# Patient Record
Sex: Male | Born: 1965 | Race: White | Hispanic: No | Marital: Married | Smoking: Current some day smoker
Health system: Southern US, Community
[De-identification: ages and names within clinical notes are randomized; demographics above are authoritative.]

## PROBLEM LIST (undated history)

## (undated) DIAGNOSIS — M109 Gout, unspecified: Secondary | ICD-10-CM

## (undated) HISTORY — PX: HERNIA REPAIR: SHX51

## (undated) HISTORY — DX: Gout, unspecified: M10.9

---

## 2009-05-27 LAB — HM COLONOSCOPY

## 2011-06-20 ENCOUNTER — Ambulatory Visit: Payer: BC Managed Care – PPO

## 2011-06-20 ENCOUNTER — Ambulatory Visit (INDEPENDENT_AMBULATORY_CARE_PROVIDER_SITE_OTHER): Payer: BC Managed Care – PPO | Admitting: Internal Medicine

## 2011-06-20 VITALS — BP 147/100 | HR 62 | Temp 98.6°F | Resp 18 | Ht 70.0 in | Wt 203.0 lb

## 2011-06-20 DIAGNOSIS — M25579 Pain in unspecified ankle and joints of unspecified foot: Secondary | ICD-10-CM

## 2011-06-20 DIAGNOSIS — M109 Gout, unspecified: Secondary | ICD-10-CM

## 2011-06-20 DIAGNOSIS — M25572 Pain in left ankle and joints of left foot: Secondary | ICD-10-CM

## 2011-06-20 MED ORDER — COLCHICINE 0.6 MG PO TABS: 0.6000 mg | ORAL_TABLET | Freq: Two times a day (BID) | ORAL | Status: DC

## 2011-06-20 MED ORDER — METHYLPREDNISOLONE ACETATE 80 MG/ML IJ SUSP
80.0000 mg | Freq: Once | INTRAMUSCULAR | Status: AC
  Administered 2011-06-20: 120 mg via INTRAMUSCULAR

## 2011-06-20 MED ORDER — HYDROCODONE-ACETAMINOPHEN 5-500 MG PO TABS: 1.0000 | ORAL_TABLET | Freq: Four times a day (QID) | ORAL | Status: AC | PRN

## 2011-06-20 NOTE — Progress Notes (Signed)
  Subjective:    Patient ID: Dustin Logan, male    DOB: [birth year], 46 y.o.   MRN: [id number]  HPI Has swollen painful foot, hx of gout attack R foot, L elbow, never L foot before. No fever, no trauma    Review of Systems ETOH, NICOTINE    Objective:   Physical Exam  L foot swollen and tender, lateral ankle and foot most tender Mildly red and warm No other joint [facility] reading (PRIMARY) by  Dr.Armoni Depass NAD, swelling present      Assessment & Plan:  Gout with normal uric acid levels Needs CPE soon Depomedrol 120mg  IM colcrys .6mg  bid prn Vicodin 5/500 prn

## 2011-06-20 NOTE — Patient Instructions (Signed)
Gout Gout is an inflammatory condition (arthritis) caused by a buildup of uric acid crystals in the joints. Uric acid is a chemical that is normally present in the blood. Under some circumstances, uric acid can form into crystals in your joints. This causes joint redness, soreness, and swelling (inflammation). Repeat attacks are common. Over time, uric acid crystals can form into masses (tophi) near a joint, causing disfigurement. Gout is treatable and often preventable. CAUSES  The disease begins with elevated levels of uric acid in the blood. Uric acid is produced by your body when it breaks down a naturally found substance called purines. This also happens when you eat certain foods such as meats and fish. Causes of an elevated uric acid level include:  Being passed down from parent to child (heredity).   Diseases that cause increased uric acid production (obesity, psoriasis, some cancers).   Excessive alcohol use.   Diet, especially diets rich in meat and seafood.   Medicines, including certain cancer-fighting drugs (chemotherapy), diuretics, and aspirin.   Chronic kidney disease. The kidneys are no longer able to remove uric acid well.   Problems with metabolism.  Conditions strongly associated with gout include:  Obesity.   High blood pressure.   High cholesterol.   Diabetes.  Not everyone with elevated uric acid levels gets gout. It is not understood why some people get gout and others do not. Surgery, joint injury, and eating too much of certain foods are some of the factors that can lead to gout. SYMPTOMS   An attack of gout comes on quickly. It causes intense pain with redness, swelling, and warmth in a joint.   Fever can occur.   Often, only one joint is involved. Certain joints are more commonly involved:   Base of the big toe.   Knee.   Ankle.   Wrist.   Finger.  Without treatment, an attack usually goes away in a few days to weeks. Between attacks, you  usually will not have symptoms, which is different from many other forms of arthritis. DIAGNOSIS  Your caregiver will suspect gout based on your symptoms and exam. Removal of fluid from the joint (arthrocentesis) is done to check for uric acid crystals. Your caregiver will give you a medicine that numbs the area (local anesthetic) and use a needle to remove joint fluid for exam. Gout is confirmed when uric acid crystals are seen in joint fluid, using a special microscope. Sometimes, blood, urine, and X-ray tests are also used. TREATMENT  There are 2 phases to gout treatment: treating the sudden onset (acute) attack and preventing attacks (prophylaxis). Treatment of an Acute Attack  Medicines are used. These include anti-inflammatory medicines or steroid medicines.   An injection of steroid medicine into the affected joint is sometimes necessary.   The painful joint is rested. Movement can worsen the arthritis.   You may use warm or cold treatments on painful joints, depending which works best for you.   Discuss the use of coffee, vitamin C, or cherries with your caregiver. These may be helpful treatment options.  Treatment to Prevent Attacks After the acute attack subsides, your caregiver may advise prophylactic medicine. These medicines either help your kidneys eliminate uric acid from your body or decrease your uric acid production. You may need to stay on these medicines for a very long time. The early phase of treatment with prophylactic medicine can be associated with an increase in acute gout attacks. For this reason, during the first few months   of treatment, your caregiver may also advise you to take medicines usually used for acute gout treatment. Be sure you understand your caregiver's directions. You should also discuss dietary treatment with your caregiver. Certain foods such as meats and fish can increase uric acid levels. Other foods such as dairy can decrease levels. Your caregiver  can give you a list of foods to avoid. HOME CARE INSTRUCTIONS   Do not take aspirin to relieve pain. This raises uric acid levels.   Only take over-the-counter or prescription medicines for pain, discomfort, or fever as directed by your caregiver.   Rest the joint as much as possible. When in bed, keep sheets and blankets off painful areas.   Keep the affected joint raised (elevated).   Use crutches if the painful joint is in your leg.   Drink enough water and fluids to keep your urine clear or pale yellow. This helps your body get rid of uric acid. Do not drink alcoholic beverages. They slow the passage of uric acid.   Follow your caregiver's dietary instructions. Pay careful attention to the amount of protein you eat. Your daily diet should emphasize fruits, vegetables, whole grains, and fat-free or low-fat milk products.   Maintain a healthy body weight.  SEEK MEDICAL CARE IF:   You have an oral temperature above 102 F (38.9 C).   You develop diarrhea, vomiting, or any side effects from medicines.   You do not feel better in 24 hours, or you are getting worse.  SEEK IMMEDIATE MEDICAL CARE IF:   Your joint becomes suddenly more tender and you have:   Chills.   An oral temperature above 102 F (38.9 C), not controlled by medicine.  MAKE SURE YOU:   Understand these instructions.   Will watch your condition.   Will get help right away if you are not doing well or get worse.  Document Released: 03/10/2000 Document Revised: 03/02/2011 Document Reviewed: 06/21/2009 Exit[employer] Patient Information 2012 [employer]. 

## 2011-12-31 ENCOUNTER — Other Ambulatory Visit: Payer: Self-pay | Admitting: Internal Medicine

## 2012-01-02 ENCOUNTER — Other Ambulatory Visit: Payer: Self-pay | Admitting: Internal Medicine

## 2012-01-13 ENCOUNTER — Other Ambulatory Visit: Payer: Self-pay | Admitting: Internal Medicine

## 2012-03-24 ENCOUNTER — Other Ambulatory Visit: Payer: Self-pay | Admitting: Physician Assistant

## 2013-10-03 ENCOUNTER — Ambulatory Visit (INDEPENDENT_AMBULATORY_CARE_PROVIDER_SITE_OTHER): Payer: BC Managed Care – PPO | Admitting: Emergency Medicine

## 2013-10-03 VITALS — BP 130/88 | HR 60 | Temp 98.6°F | Resp 18 | Ht 70.75 in | Wt 202.0 lb

## 2013-10-03 DIAGNOSIS — M109 Gout, unspecified: Secondary | ICD-10-CM | POA: Insufficient documentation

## 2013-10-03 DIAGNOSIS — M10071 Idiopathic gout, right ankle and foot: Secondary | ICD-10-CM

## 2013-10-03 MED ORDER — METHYLPREDNISOLONE ACETATE 80 MG/ML IJ SUSP
120.0000 mg | Freq: Once | INTRAMUSCULAR | Status: AC
  Administered 2013-10-03: 120 mg via INTRAMUSCULAR

## 2013-10-03 MED ORDER — COLCHICINE 0.6 MG PO TABS: ORAL_TABLET | ORAL | Status: DC

## 2013-10-03 NOTE — Progress Notes (Signed)
[facility] [address], [place] [id number] [phone]  Date:  10/03/2013   Name:  Dustin Logan   DOB:  05-24-1965   MRN:  [id number]  PCP:  Shade FloodGREENE,JEFFREY R, MD    Chief Complaint: Gout   History of Present Illness:  Dustin Logan is a 48 y.o. very pleasant male patient who presents with the following:  He is here today with a gout attack.  He has it in his right leg- he notes this right knee is swollen, and his right ankle and toe are bothersome.  He has not had a severe attack in about one year.  He had a depo- medrol shot a couple of years ago that helped him a lot.    He is otherwise generally healthy NKI to his leg, this seems like a gout attack to him.   He has no other complaint today  There are no active problems to display for this patient.   Past Medical History  Diagnosis Date  . Gout attack     No past surgical history on file.  History  Substance Use Topics  . Smoking status: Current Some Day Smoker    Types: Cigars  . Smokeless tobacco: Never Used     Comment: smokes 10 cigars in a week  . Alcohol Use: Not on file    No family history on file.  No Known Allergies  Medication list has been reviewed and updated.  Current Outpatient Prescriptions on File Prior to Visit  Medication Sig Dispense Refill  . COLCRYS 0.6 MG tablet TAKE 1 TABLET TWICE A DAY  60 tablet  1   No current facility-administered medications on file prior to visit.    Review of Systems:  As per HPI- otherwise negative.   Physical Examination: Filed Vitals:   10/03/13 1109  BP: 130/88  Pulse: 60  Temp: 98.6 F (37 C)  Resp: 18   Filed Vitals:   10/03/13 1109  Height: 5' 10.75" (1.797 m)  Weight: 202 lb (91.627 kg)   Body mass index is 28.37 kg/(m^2). Ideal Body Weight: Weight in (lb) to have BMI = 25: 177.6  GEN: WDWN, NAD, Non-toxic, A & O x 3, looks well, does have a tic where he shrugs his shoulders frequently  HEENT: Atraumatic,  Normocephalic. Neck supple. No masses, No LAD. Ears and Nose: No external deformity. CV: RRR, No M/G/R. No JVD. No thrill. No extra heart sounds. PULM: CTA B, no wheezes, crackles, rhonchi. No retractions. No resp. distress. No accessory muscle use. EXTR: No c/c/e NEURO Normal gait.  PSYCH: Normally interactive. Conversant. Not depressed or anxious appearing.  Calm demeanor.  Right knee: effusion, tender to ROM, minimal warmth. His main tenderness right now is in his ankle- and he also has some sx in his 2nd toe and great toe.  Normal ROM of ankle.     Assessment and Plan: Idiopathic gout of right foot, unspecified chronicity - Plan: methylPREDNISolone acetate (DEPO-MEDROL) injection 120 mg, colchicine (COLCRYS) 0.6 MG tablet  Given IM injection of depo- medrol today, and refilled his colchicine and explained use. No NSIADS for a few days See patient instructions for more details.     Signed Abbe AmsterdamJessica Copland, MD

## 2013-10-03 NOTE — Patient Instructions (Signed)
We gave you a shot of steroids for your gout today. You can also use colchicine as needed for gout flare.  Let me know if you are not better in the next couple of days- Sooner if worse.

## 2014-09-04 ENCOUNTER — Telehealth: Payer: Self-pay | Admitting: Family Medicine

## 2014-09-04 NOTE — Telephone Encounter (Signed)
Patient is requesting gout medication.    [id number]

## 2015-01-04 ENCOUNTER — Ambulatory Visit (INDEPENDENT_AMBULATORY_CARE_PROVIDER_SITE_OTHER): Payer: BLUE CROSS/BLUE SHIELD | Admitting: Physician Assistant

## 2015-01-04 VITALS — BP 126/84 | HR 77 | Temp 98.8°F | Resp 18 | Ht 71.0 in | Wt 201.0 lb

## 2015-01-04 DIAGNOSIS — M10071 Idiopathic gout, right ankle and foot: Secondary | ICD-10-CM | POA: Diagnosis not present

## 2015-01-04 DIAGNOSIS — M109 Gout, unspecified: Secondary | ICD-10-CM

## 2015-01-04 LAB — POCT CBC
GRANULOCYTE PERCENT: 79.2 % (ref 37–80)
HCT, POC: 41.4 % — AB (ref 43.5–53.7)
Hemoglobin: 14.3 g/dL (ref 14.1–18.1)
LYMPH, POC: 1.4 (ref 0.6–3.4)
MCH, POC: 28.9 pg (ref 27–31.2)
MCHC: 34.5 g/dL (ref 31.8–35.4)
MCV: 83.7 fL (ref 80–97)
MID (CBC): 0.7 (ref 0–0.9)
MPV: 6.7 fL (ref 0–99.8)
PLATELET COUNT, POC: 328 10*3/uL (ref 142–424)
POC Granulocyte: 7.9 — AB (ref 2–6.9)
POC LYMPH %: 13.7 % (ref 10–50)
POC MID %: 7.1 %M (ref 0–12)
RBC: 4.94 M/uL (ref 4.69–6.13)
RDW, POC: 12.7 %
WBC: 10 10*3/uL (ref 4.6–10.2)

## 2015-01-04 LAB — URIC ACID: URIC ACID, SERUM: 8.2 mg/dL — AB (ref 4.0–7.8)

## 2015-01-04 MED ORDER — TRIAMCINOLONE ACETONIDE 40 MG/ML IJ SUSP
40.0000 mg | Freq: Once | INTRAMUSCULAR | Status: AC
  Administered 2015-01-04: 40 mg via INTRAMUSCULAR

## 2015-01-04 MED ORDER — HYDROCODONE-ACETAMINOPHEN 5-325 MG PO TABS: 1.0000 | ORAL_TABLET | Freq: Three times a day (TID) | ORAL | Status: DC | PRN

## 2015-01-04 MED ORDER — INDOMETHACIN 25 MG PO CAPS: 25.0000 mg | ORAL_CAPSULE | Freq: Two times a day (BID) | ORAL | Status: DC

## 2015-01-04 MED ORDER — ALLOPURINOL 100 MG PO TABS: 100.0000 mg | ORAL_TABLET | Freq: Every day | ORAL | Status: DC

## 2015-01-04 NOTE — Progress Notes (Signed)
Subjective:    Patient ID: Dustin Logan, male    DOB: July 19, 1965, 49 y.o.   MRN: [id number]  HPI Patient presents for gout flare in right ankle that has been present since yesterday. Pain is marginally less than yesterday, however, was painful just to put sandal on and is using crutches so he does not have to bear weight onto ankle. Endorses swelling or redness, as well. Denies fever. States that his colchicine is no longer working.    Review of Systems  Constitutional: Positive for chills. Negative for fever and fatigue.  Respiratory: Negative.   Cardiovascular: Negative.   Gastrointestinal: Negative.   Musculoskeletal: Positive for joint swelling (left ankle), arthralgias and gait problem. Negative for myalgias.  Neurological: Negative for weakness and numbness.       Objective:   Physical Exam  Constitutional: He is oriented to person, place, and time. He appears well-developed and well-nourished. No distress.  Blood pressure 126/84, pulse 77, temperature 98.8 F (37.1 C), temperature source Oral, resp. rate 18, height  (1.803 m), weight 201 lb (91.173 kg), SpO2 99 %.  HENT:  Head: Normocephalic and atraumatic.  Right Ear: External ear normal.  Left Ear: External ear normal.  Eyes: Conjunctivae are normal. Pupils are equal, round, and reactive to light. Right eye exhibits no discharge. Left eye exhibits no discharge. No scleral icterus.  Cardiovascular: Normal rate, regular rhythm and normal heart sounds.  Exam reveals no gallop and no friction rub.   No murmur heard. Pulmonary/Chest: Effort normal and breath sounds normal. No respiratory distress. He has no wheezes. He has no rales.  Abdominal: Soft. Bowel sounds are normal. He exhibits no distension. There is no tenderness. There is no rebound and no guarding.  Musculoskeletal: He exhibits edema and tenderness.       Left ankle: He exhibits decreased range of motion (secondary to pain) and swelling. He exhibits no  ecchymosis, no deformity, no laceration and normal pulse. Tenderness (entire ankle). Achilles tendon normal.       Left foot: There is tenderness and swelling. There is normal range of motion, no bony tenderness, normal capillary refill, no crepitus and no deformity.  Neurological: He is alert and oriented to person, place, and time. He has normal strength. He displays no atrophy. No cranial nerve deficit or sensory deficit. He exhibits normal muscle tone.  Skin: Skin is warm, dry and intact. No rash noted. He is not diaphoretic. No erythema. No pallor.  Psychiatric: He has a normal mood and affect. His behavior is normal. Judgment and thought content normal.   Results for orders placed or performed in visit on 01/04/15  Uric Acid  Result Value Ref Range   Uric Acid, Serum 8.2 (H) 4.0 - 7.8 mg/dL  POCT CBC  Result Value Ref Range   WBC 10.0 4.6 - 10.2 K/uL   Lymph, poc 1.4 0.6 - 3.4   POC LYMPH PERCENT 13.7 10 - 50 %L   MID (cbc) 0.7 0 - 0.9   POC MID % 7.1 0 - 12 %M   POC Granulocyte 7.9 (A) 2 - 6.9   Granulocyte percent 79.2 37 - 80 %G   RBC 4.94 4.69 - 6.13 M/uL   Hemoglobin 14.3 14.1 - 18.1 g/dL   HCT, POC 40.9 (A) 81.1 - 53.7 %   MCV 83.7 80 - 97 fL   MCH, POC 28.9 27 - 31.2 pg   MCHC 34.5 31.8 - 35.4 g/dL   RDW, POC 91.4 %  Platelet Count, POC 328 142 - 424 K/uL   MPV 6.7 0 - 99.8 fL       Assessment & Plan:  1. Acute gout of right ankle, unspecified cause D/C colchicine. Indomethacin for 2 weeks then begin allopurinol. One week following start of allopurinol RTC for repeat uric acid. If elevated, will increase allopurinol.  - indomethacin (INDOCIN) 25 MG capsule; Take 1 capsule (25 mg total) by mouth 2 (two) times daily with a meal.  Dispense: 48 capsule; Refill: 1 - Uric Acid - POCT CBC - allopurinol (ZYLOPRIM) 100 MG tablet; Take 1 tablet (100 mg total) by mouth daily.  Dispense: 30 tablet; Refill: 6 - triamcinolone acetonide (KENALOG-40) injection 40 mg; Inject 1 mL  (40 mg total) into the muscle once. - HYDROcodone-acetaminophen (NORCO) 5-325 MG tablet; Take 1 tablet by mouth every 8 (eight) hours as needed for moderate pain.  Dispense: 15 tablet; Refill: 0   Leonetta Mcgivern PA-C  [facility] 01/05/2015 12:35 PM

## 2015-01-05 ENCOUNTER — Encounter: Payer: Self-pay | Admitting: Physician Assistant

## 2015-01-05 ENCOUNTER — Telehealth: Payer: Self-pay | Admitting: Physician Assistant

## 2015-01-05 NOTE — Telephone Encounter (Signed)
Relayed elevated uric acid levels. Will return after been on allopurinol for one week to check level and will decide if need to increase at that time. States feeling better already and agrees to return for repeat labs.

## 2015-01-28 ENCOUNTER — Ambulatory Visit (INDEPENDENT_AMBULATORY_CARE_PROVIDER_SITE_OTHER): Payer: BLUE CROSS/BLUE SHIELD | Admitting: Family Medicine

## 2015-01-28 VITALS — BP 122/88 | HR 102 | Temp 99.7°F | Ht 71.0 in | Wt 194.1 lb

## 2015-01-28 DIAGNOSIS — M10071 Idiopathic gout, right ankle and foot: Secondary | ICD-10-CM

## 2015-01-28 DIAGNOSIS — M10062 Idiopathic gout, left knee: Secondary | ICD-10-CM | POA: Diagnosis not present

## 2015-01-28 DIAGNOSIS — M109 Gout, unspecified: Secondary | ICD-10-CM

## 2015-01-28 MED ORDER — TRIAMCINOLONE ACETONIDE 40 MG/ML IJ SUSP
40.0000 mg | Freq: Once | INTRAMUSCULAR | Status: AC
  Administered 2015-01-28: 40 mg via INTRAMUSCULAR

## 2015-01-28 MED ORDER — OXYCODONE-ACETAMINOPHEN 5-325 MG PO TABS: 1.0000 | ORAL_TABLET | Freq: Three times a day (TID) | ORAL | Status: DC | PRN

## 2015-01-28 MED ORDER — INDOMETHACIN 50 MG PO CAPS: 50.0000 mg | ORAL_CAPSULE | Freq: Two times a day (BID) | ORAL | Status: DC

## 2015-01-28 NOTE — Patient Instructions (Signed)
We gave you a shot of steroids (kenalog) today- this should help with your gout Ice and elevation may also help Wait a couple of days and then use the indomethacin as needed for pain You can also use the percocet as needed- remember this is a stronger pain medication and can make you pretty sleepy Once your acute gout is resolved you can start on the allopurinol to hopefully reduce your uric acid levels and prevent gout. Once you have been on it for a few weeks come by for a lab visit only and we can check your uric acid level

## 2015-01-28 NOTE — Progress Notes (Signed)
[facility] [address] [place] [address] [phone]  Date:  01/28/2015   Name:  Dustin Logan   DOB:  12/22/1965   MRN:  [id number]  PCP:  Shade Flood, MD    Chief Complaint: Joint Swelling   History of Present Illness:  Dustin Logan is a 49 y.o. very pleasant male patient who presents with the following:  He is here today with probably gout in his left knee  Started yesterday- got worse as the day went on He has had gout in the past and it does tend to be in this knee frequently   NKI to cause the knee pain  About 3 weeks ago he was here with an attack- he took the indomethacin for 2 weeks and then started allopurinal 2 days ago.   We also gave him a shot of kenalog last time he was here- this helped him and he would like to do this again  He tried the hydrocodone but it is not strong enough- he did not sleep at all last night  He has not had good luck with colchicine.  Does not like to take this He otherwise feels well  Patient Active Problem List   Diagnosis Date Noted  . Gout 10/03/2013    Past Medical History  Diagnosis Date  . Gout attack     Past Surgical History  Procedure Laterality Date  . Hernia repair      Social History  Substance Use Topics  . Smoking status: Current Some Day Smoker    Types: Cigars  . Smokeless tobacco: Never Used     Comment: smokes 10 cigars in a week  . Alcohol Use: 7.2 oz/week    12 Standard drinks or equivalent per week    Family History  Problem Relation Age of Onset  . Dementia Father     No Known Allergies  Medication list has been reviewed and updated.  Current Outpatient Prescriptions on File Prior to Visit  Medication Sig Dispense Refill  . allopurinol (ZYLOPRIM) 100 MG tablet Take 1 tablet (100 mg total) by mouth daily. 30 tablet 6  . HYDROcodone-acetaminophen (NORCO) 5-325 MG tablet Take 1 tablet by mouth every 8 (eight) hours as needed for moderate pain. 15 tablet 0  .  indomethacin (INDOCIN) 25 MG capsule Take 1 capsule (25 mg total) by mouth 2 (two) times daily with a meal. 48 capsule 1  . colchicine 0.6 MG tablet TAKE 2 PILLS, THEN TAKE 1 MORE AN HOUR LATER.  REPEAT AS NEEDED FOR GOUT FLARE. "OV NEEDED FOR ADDITIONAL REFILLS" (Patient not taking: Reported on 01/28/2015) 30 tablet 0   No current facility-administered medications on file prior to visit.    Review of Systems:  As per HPI- otherwise negative.   Physical Examination: Filed Vitals:   01/28/15 1757  BP: 122/88  Pulse: 102  Temp: 99.7 F (37.6 C)   Filed Vitals:   01/28/15 1757  Height:  (1.803 m)  Weight: 194 lb 2 oz (88.055 kg)   Body mass index is 27.09 kg/(m^2). Ideal Body Weight: Weight in (lb) to have BMI = 25: 178.9  GEN: WDWN, NAD, Non-toxic, A & O x 3 HEENT: Atraumatic, Normocephalic. Neck supple. No masses, No LAD. Ears and Nose: No external deformity. CV: RRR, No M/G/R. No JVD. No thrill. No extra heart sounds. PULM: CTA B, no wheezes, crackles, rhonchi. No retractions. No resp. distress. No accessory muscle use. EXTR: No c/c/e NEURO using  crutches to keep weight off knee PSYCH: Normally interactive. Conversant. Not depressed or anxious appearing.  Calm demeanor.  Left knee: the knee is swollen with effusion and tender.  Slightly warm, not red ROM is limited due to swelling  Assessment and Plan: Acute idiopathic gout of left knee - Plan: triamcinolone acetonide (KENALOG-40) injection 40 mg, oxyCODONE-acetaminophen (ROXICET) 5-325 MG tablet, Uric Acid  Acute gout of right ankle, unspecified cause - Plan: indomethacin (INDOCIN) 50 MG capsule  Treat acute gout flare with kenalog IM, rx for oxycodone for pain, indomethacin Plan to have him start allopurinol once his sx are back to baseline and check uric acid level in a few weeks   Signed Abbe AmsterdamJessica Weiland Tomich, MD

## 2015-05-24 ENCOUNTER — Other Ambulatory Visit: Payer: Self-pay | Admitting: Family Medicine

## 2015-05-24 NOTE — Telephone Encounter (Signed)
Pt is requesting a refill of indomethacin (INDOCIN) 50 MG capsule and oxyCODONE-acetaminophen (ROXICET) 5-325 MG tablet.  Pt is requesting the refill be sent to [facility] on [address].  PLEASE CALL WHEN READY.

## 2015-05-27 ENCOUNTER — Telehealth: Payer: Self-pay

## 2015-05-27 NOTE — Telephone Encounter (Signed)
He needs to come in. Correct?

## 2015-05-27 NOTE — Telephone Encounter (Signed)
Pt's wife called in wanting refills on her husband's scripts. indomethacin (INDOCIN) 50 MG capsule [[id number]] and his oxyCODONE-acetaminophen (ROXICET) 5-325 MG tablet [[id number]. CB # [id number] (M)

## 2015-05-27 NOTE — Telephone Encounter (Signed)
Pt called checking the status of rx... Req./// req. Pt to hold... Pt hung.Marland KitchenMarland Kitchen

## 2015-05-31 ENCOUNTER — Ambulatory Visit (INDEPENDENT_AMBULATORY_CARE_PROVIDER_SITE_OTHER): Payer: BLUE CROSS/BLUE SHIELD | Admitting: Physician Assistant

## 2015-05-31 ENCOUNTER — Encounter: Payer: Self-pay | Admitting: Physician Assistant

## 2015-05-31 VITALS — BP 158/100 | HR 71 | Temp 98.6°F | Resp 17 | Ht 71.26 in | Wt 205.0 lb

## 2015-05-31 DIAGNOSIS — M10062 Idiopathic gout, left knee: Secondary | ICD-10-CM

## 2015-05-31 DIAGNOSIS — M10071 Idiopathic gout, right ankle and foot: Secondary | ICD-10-CM | POA: Diagnosis not present

## 2015-05-31 MED ORDER — ALLOPURINOL 100 MG PO TABS: 200.0000 mg | ORAL_TABLET | Freq: Every day | ORAL | Status: DC

## 2015-05-31 MED ORDER — OXYCODONE-ACETAMINOPHEN 5-325 MG PO TABS: 1.0000 | ORAL_TABLET | Freq: Three times a day (TID) | ORAL | Status: DC | PRN

## 2015-05-31 MED ORDER — INDOMETHACIN 50 MG PO CAPS: 50.0000 mg | ORAL_CAPSULE | Freq: Two times a day (BID) | ORAL | Status: DC

## 2015-05-31 NOTE — Progress Notes (Signed)
Patient ID: Dustin Logan, male    DOB: July 02, 1965, 50 y.o.   MRN: [id number]  PCP: Shade FloodGREENE,JEFFREY R, MD  Subjective:   Chief Complaint  Patient presents with  . Medication Refill    zyloprim    HPI Presents for medication refill and bloodwork. He is accompanied by his wife.  He was seen by Dr. Patsy Lageropland on 11/03 for Gout symptoms and was prescribed allopurinol 100mg , Indomethacin 50mg , and Roxicet 5-325 PRN. Gout present in his left knee and right ankle. Since his last visit he has been compliant with his Allopurinol medication, and has had about 10 flare ups with tenderness, swelling and pain symptoms.   Today he has no acute pain but would like to renew his medication before he runs out. He endorses cutting back on cheese, dairy products, beer, and red meat as much as possible. He currently denies tenderness, swelling, redness or pain in his joint spaces.   Patient has a very labor intensive job and is constantly experiencing arthralgias and generalized joint pain in his lower extremities.   Review of Systems As above.    Patient Active Problem List   Diagnosis Date Noted  . Gout 10/03/2013     Prior to Admission medications   Medication Sig Start Date End Date Taking? Authorizing Provider  allopurinol (ZYLOPRIM) 100 MG tablet Take 2 tablets (200 mg total) by mouth daily. 05/31/15  Yes Nayla Dias, PA-C  indomethacin (INDOCIN) 50 MG capsule Take 1 capsule (50 mg total) by mouth 2 (two) times daily with a meal. Use as needed for gout 05/31/15  Yes Renika Shiflet, PA-C  oxyCODONE-acetaminophen (ROXICET) 5-325 MG tablet Take 1-2 tablets by mouth every 8 (eight) hours as needed for severe pain. 05/31/15  Yes Marca Gadsby, PA-C     No Known Allergies     Objective:  Physical Exam  Constitutional: He is oriented to person, place, and time. He appears well-developed and well-nourished. He is active and cooperative. No distress.  BP 158/100 mmHg  Pulse 71  Temp(Src) 98.6 F  (37 C) (Oral)  Resp 17  Ht 5' 11.26" (1.81 m)  Wt 205 lb (92.987 kg)  BMI 28.38 kg/m2  SpO2 98%  HENT:  Head: Normocephalic and atraumatic.  Right Ear: Hearing normal.  Left Ear: Hearing normal.  Eyes: Conjunctivae are normal. No scleral icterus.  Neck: Normal range of motion. Neck supple. No thyromegaly present.  Cardiovascular: Normal rate, regular rhythm and normal heart sounds.   Pulses:      Radial pulses are 2+ on the right side, and 2+ on the left side.  Pulmonary/Chest: Effort normal and breath sounds normal.  Lymphadenopathy:       Head (right side): No tonsillar, no preauricular, no posterior auricular and no occipital adenopathy present.       Head (left side): No tonsillar, no preauricular, no posterior auricular and no occipital adenopathy present.    He has no cervical adenopathy.       Right: No supraclavicular adenopathy present.       Left: No supraclavicular adenopathy present.  Neurological: He is alert and oriented to person, place, and time. No sensory deficit.  Skin: Skin is warm, dry and intact. No rash noted. No cyanosis or erythema. Nails show no clubbing.  Psychiatric: He has a normal mood and affect. His speech is normal and behavior is normal.           Assessment & Plan:   1. Acute idiopathic gout of right ankle  2. Acute idiopathic gout of left knee Not acutely flared presently. INCREASE allopurinol to 200 mg daily and recheck the uric acid level today. Continue PRN indocin and oxycodone. - Comprehensive metabolic panel - allopurinol (ZYLOPRIM) 100 MG tablet; Take 2 tablets (200 mg total) by mouth daily.  Dispense: 180 tablet; Refill: 3 - oxyCODONE-acetaminophen (ROXICET) 5-325 MG tablet; Take 1-2 tablets by mouth every 8 (eight) hours as needed for severe pain.  Dispense: 30 tablet; Refill: 0 - indomethacin (INDOCIN) 50 MG capsule; Take 1 capsule (50 mg total) by mouth 2 (two) times daily with a meal. Use as needed for gout  Dispense: 60 capsule;  Refill: 0 - Uric Acid   Return in about 6 weeks (around 07/12/2015) for Repeat uric acid and blood pressure check with Dr. Neva Seat.   Fernande Bras, PA-C Physician Assistant-Certified [facility]

## 2015-05-31 NOTE — Telephone Encounter (Signed)
Yes, Patient needs to come in

## 2015-05-31 NOTE — Telephone Encounter (Signed)
Spoke with pt, advised message from MakenaStephanie.

## 2015-05-31 NOTE — Addendum Note (Signed)
Addended by: Fernande BrasJEFFERY, Norwood Quezada S on: 05/31/2015 07:19 PM   Modules accepted: Orders

## 2015-05-31 NOTE — Progress Notes (Signed)
Subjective:     Patient ID: Dustin Logan, male   DOB: [birth year], 50 y.o.   MRN: [id number]  HPI The patient is a 50 year old white male who presents today for a medical refill and bloodwork. He was seen by Dr. Dallas Schimkeopeland on 11/03 for Gout symptoms and was prescribed allopurinol 100mg , Indomethacin 50mg , and Roxicet 5-325 PRN. Gout present in his left knee and right ankle. Since his last visit he has been compliant with his Allopurinol medication, and has had about 10 flare ups with tenderness, swelling and pain symptoms. Today he has no acute pain but would like to renew his medication before he runs out. He endorses cutting back on cheese, dairy products, beer, and red meat as much as possible. He currently denies tenderness, swelling, redness or pain in his joint spaces. Patient has a very labor intensive job and is constantly experiencing arthralgias and generalized joint pain in his lower extremities.   Review of Systems  Pertinent ROS are indicated in HPI as above     Objective:   Physical Exam General: well developed, well nourished patient in no acute distress. Facial tic noted on entrance to the room. CV: Regular rate and rhythm, S1 and S2 distinct, no murmurs, rubs, gallops, distal pulses 2+ intact, radial, dorsalis pedis, and posterior tibialis  Pulm: Lungs clear to auscultation, no wheezes, ronchi, rales. Good equal air expansion MSK: Left knee swollen in with no tenderness or erythema on palpation and inspection, right knee atraumatic and normal in size. right ankle swollen with no tenderness or erythema on inspection and palpation, left ankle atraumatic and normal in size.     Assessment:     Patient is a 50 year old white male with a past medical history of Gout, presented today for medical refill and blood draw. Due to increased amount of flare ups in the past 4-5 months, will increase Allopurinol dose to 200mg  daily and continue current Roxi and indomethacin PRN. Will draw Uric Acid  blood levels today, while patient is not having a flare up currently.     Plan:     1. Acute idiopathic gout of right ankle and left knee  - allopurinol (ZYLOPRIM) 100 MG tablet; Take 2 tablets (200 mg total) by mouth daily.  Dispense: 180 tablet; Refill: 3 - oxyCODONE-acetaminophen (ROXICET) 5-325 MG tablet; Take 1-2 tablets by mouth every 8 (eight) hours as needed for severe pain.  Dispense: 30 tablet; Refill: 0 - indomethacin (INDOCIN) 50 MG capsule; Take 1 capsule (50 mg total) by mouth 2 (two) times daily with a meal. Use as needed for gout  Dispense: 60 capsule; Refill: 0 - Uric Acid blood levels drawn - Encouraged patient to try additional natural remedies including black cherry tart juice and glucosamine/chondriontin for joint pain. - Follow up in 6 weeks for uric acid recheck, trend levels when patient is not having an acute flare up  2. Elevated Blood Pressure - Will follow up in 6 weeks for blood pressure recheck at next visit

## 2015-05-31 NOTE — Patient Instructions (Signed)

## 2015-06-01 ENCOUNTER — Encounter: Payer: Self-pay | Admitting: Physician Assistant

## 2015-06-01 DIAGNOSIS — F952 Tourette's disorder: Secondary | ICD-10-CM | POA: Insufficient documentation

## 2015-06-01 LAB — COMPREHENSIVE METABOLIC PANEL
ALT: 21 U/L (ref 9–46)
AST: 23 U/L (ref 10–40)
Albumin: 4.1 g/dL (ref 3.6–5.1)
Alkaline Phosphatase: 70 U/L (ref 40–115)
BUN: 11 mg/dL (ref 7–25)
CHLORIDE: 104 mmol/L (ref 98–110)
CO2: 24 mmol/L (ref 20–31)
Calcium: 9.2 mg/dL (ref 8.6–10.3)
Creat: 1.04 mg/dL (ref 0.60–1.35)
Glucose, Bld: 98 mg/dL (ref 65–99)
POTASSIUM: 4 mmol/L (ref 3.5–5.3)
Sodium: 138 mmol/L (ref 135–146)
TOTAL PROTEIN: 6.9 g/dL (ref 6.1–8.1)
Total Bilirubin: 0.5 mg/dL (ref 0.2–1.2)

## 2015-06-01 LAB — URIC ACID: URIC ACID, SERUM: 6.5 mg/dL (ref 4.0–7.8)

## 2015-08-07 ENCOUNTER — Other Ambulatory Visit: Payer: Self-pay | Admitting: Physician Assistant

## 2016-03-26 ENCOUNTER — Other Ambulatory Visit: Payer: Self-pay | Admitting: Physician Assistant

## 2016-05-29 ENCOUNTER — Other Ambulatory Visit: Payer: Self-pay | Admitting: Physician Assistant

## 2016-05-29 DIAGNOSIS — M10071 Idiopathic gout, right ankle and foot: Secondary | ICD-10-CM

## 2016-05-29 NOTE — Telephone Encounter (Signed)
Please call and schedule patient and appt within 3 months with Dr Neva SeatGreene

## 2016-07-15 ENCOUNTER — Other Ambulatory Visit: Payer: Self-pay | Admitting: Physician Assistant

## 2016-08-23 ENCOUNTER — Other Ambulatory Visit: Payer: Self-pay | Admitting: Physician Assistant

## 2016-08-23 DIAGNOSIS — M10071 Idiopathic gout, right ankle and foot: Secondary | ICD-10-CM

## 2016-08-25 ENCOUNTER — Ambulatory Visit (INDEPENDENT_AMBULATORY_CARE_PROVIDER_SITE_OTHER): Payer: BLUE CROSS/BLUE SHIELD | Admitting: Physician Assistant

## 2016-08-25 ENCOUNTER — Encounter: Payer: Self-pay | Admitting: Physician Assistant

## 2016-08-25 DIAGNOSIS — M10071 Idiopathic gout, right ankle and foot: Secondary | ICD-10-CM | POA: Diagnosis not present

## 2016-08-25 MED ORDER — OXYCODONE-ACETAMINOPHEN 5-325 MG PO TABS: 1.0000 | ORAL_TABLET | Freq: Three times a day (TID) | ORAL | 0 refills | Status: DC | PRN

## 2016-08-25 MED ORDER — ALLOPURINOL 100 MG PO TABS: 200.0000 mg | ORAL_TABLET | Freq: Every day | ORAL | 4 refills | Status: DC

## 2016-08-25 NOTE — Progress Notes (Signed)
   Dustin FriedSonny Husser  MRN: [id number] DOB: [birth year]  PCP: Shade FloodGreene, Jeffrey R, MD  Chief Complaint  Patient presents with  . Medication Refill    Allopurinol 100 mg    Subjective:  Pt presents to clinic for medication refill.  He has been doing really well on the allopurinol.  Rare gout attack - he still has indomethacin - and Oxycodone #4 pills left from the last year.  Review of Systems  Patient Active Problem List   Diagnosis Date Noted  . Tourette's syndrome 06/01/2015  . Gout 10/03/2013    Current Outpatient Prescriptions on File Prior to Visit  Medication Sig Dispense Refill  . indomethacin (INDOCIN) 50 MG capsule TAKE 1 CAPSULE (50 MG TOTAL) BY MOUTH 2 (TWO) TIMES DAILY WITH A MEAL. USE AS NEEDED FOR GOUT 60 capsule 2   No current facility-administered medications on file prior to visit.     No Known Allergies  Pt patients past, family and social history were reviewed and updated.   Objective:  BP 124/84 (BP Location: Left Arm, Cuff Size: Large)   Pulse 78   Temp 98 F (36.7 C) (Oral)   Resp 16   Ht 5' 10.5" (1.791 m)   Wt 208 lb (94.3 kg)   SpO2 100%   BMI 29.42 kg/m   Physical Exam  Constitutional: He is oriented to person, place, and time and well-developed, well-nourished, and in no distress.  HENT:  Head: Normocephalic and atraumatic.  Right Ear: External ear normal.  Left Ear: External ear normal.  Eyes: Conjunctivae are normal.  Neck: Normal range of motion.  Cardiovascular: Normal rate, regular rhythm and normal heart sounds.   No murmur heard. Pulmonary/Chest: Effort normal and breath sounds normal. He has no wheezes.  Neurological: He is alert and oriented to person, place, and time. Gait normal.  Skin: Skin is warm and dry.  Psychiatric: Mood, memory, affect and judgment normal.    Assessment and Plan :  Acute idiopathic gout of right ankle - Plan: allopurinol (ZYLOPRIM) 100 MG tablet, Uric Acid, oxyCODONE-acetaminophen (ROXICET) 5-325 MG  tablet check labs - continue medications -   Benny LennertSarah Adonias Demore PA-C  [facility] at [facility] 08/25/2016 7:30 PM

## 2016-08-25 NOTE — Patient Instructions (Signed)
     IF you received an x-ray today, you will receive an invoice from [facility]. Please contact  Radiology at [phone] with questions or concerns regarding your invoice.   IF you received labwork today, you will receive an invoice from [facility]. Please contact [facility] at [phone] with questions or concerns regarding your invoice.   Our billing staff will not be able to assist you with questions regarding bills from these companies.  You will be contacted with the lab results as soon as they are available. The fastest way to get your results is to activate your My Chart account. Instructions are located on the last page of this paperwork. If you have not heard from us regarding the results in 2 weeks, please contact this office.     

## 2016-08-26 LAB — URIC ACID: Uric Acid: 5.2 mg/dL (ref 3.7–8.6)

## 2017-02-22 ENCOUNTER — Other Ambulatory Visit: Payer: Self-pay | Admitting: Physician Assistant

## 2017-03-09 ENCOUNTER — Other Ambulatory Visit: Payer: Self-pay

## 2017-03-09 MED ORDER — INDOMETHACIN 50 MG PO CAPS: ORAL_CAPSULE | ORAL | 0 refills | Status: DC

## 2017-07-31 ENCOUNTER — Other Ambulatory Visit: Payer: Self-pay | Admitting: Family Medicine

## 2017-10-21 ENCOUNTER — Other Ambulatory Visit: Payer: Self-pay | Admitting: Family Medicine

## 2017-10-31 ENCOUNTER — Other Ambulatory Visit: Payer: Self-pay | Admitting: Physician Assistant

## 2017-10-31 DIAGNOSIS — M10071 Idiopathic gout, right ankle and foot: Secondary | ICD-10-CM

## 2017-10-31 NOTE — Telephone Encounter (Signed)
allopurinol refill Last Refill:08/25/16 # 180 tab 4 RF Last OV: 08/25/16 PCP: Dr Neva SeatGreene [facility] [address]  Last uric acid: 08/25/16 : 5.2

## 2017-11-07 ENCOUNTER — Encounter: Payer: Self-pay | Admitting: Urgent Care

## 2017-11-07 ENCOUNTER — Ambulatory Visit: Payer: BLUE CROSS/BLUE SHIELD | Admitting: Urgent Care

## 2017-11-07 VITALS — BP 157/111 | HR 70 | Temp 98.5°F | Resp 18 | Ht 70.5 in | Wt 217.4 lb

## 2017-11-07 DIAGNOSIS — R030 Elevated blood-pressure reading, without diagnosis of hypertension: Secondary | ICD-10-CM | POA: Diagnosis not present

## 2017-11-07 DIAGNOSIS — M25561 Pain in right knee: Secondary | ICD-10-CM | POA: Diagnosis not present

## 2017-11-07 DIAGNOSIS — M109 Gout, unspecified: Secondary | ICD-10-CM

## 2017-11-07 DIAGNOSIS — F952 Tourette's disorder: Secondary | ICD-10-CM

## 2017-11-07 DIAGNOSIS — G8929 Other chronic pain: Secondary | ICD-10-CM | POA: Diagnosis not present

## 2017-11-07 DIAGNOSIS — M25461 Effusion, right knee: Secondary | ICD-10-CM

## 2017-11-07 MED ORDER — CYCLOBENZAPRINE HCL 10 MG PO TABS: 10.0000 mg | ORAL_TABLET | Freq: Every day | ORAL | 0 refills | Status: DC

## 2017-11-07 MED ORDER — PREDNISONE 20 MG PO TABS: ORAL_TABLET | ORAL | 0 refills | Status: DC

## 2017-11-07 NOTE — Patient Instructions (Addendum)
Gout Gout is painful swelling that can happen in some of your joints. Gout is a type of arthritis. This condition is caused by having too much uric acid in your body. Uric acid is a chemical that is made when your body breaks down substances called purines. If your body has too much uric acid, sharp crystals can form and build up in your joints. This causes pain and swelling. Gout attacks can happen quickly and be very painful (acute gout). Over time, the attacks can affect more joints and happen more often (chronic gout). Follow these instructions at home: During a Gout Attack  If directed, put ice on the painful area: ? Put ice in a plastic bag. ? Place a towel between your skin and the bag. ? Leave the ice on for 20 minutes, 2-3 times a day.  Rest the joint as much as possible. If the joint is in your leg, you may be given crutches to use.  Raise (elevate) the painful joint above the level of your heart as often as you can.  Drink enough fluids to keep your pee (urine) clear or pale yellow.  Take over-the-counter and prescription medicines only as told by your doctor.  Do not drive or use heavy machinery while taking prescription pain medicine.  Follow instructions from your doctor about what you can or cannot eat and drink.  Return to your normal activities as told by your doctor. Ask your doctor what activities are safe for you. Avoiding Future Gout Attacks  Follow a low-purine diet as told by a specialist (dietitian) or your doctor. Avoid foods and drinks that have a lot of purines, such as: ? Liver. ? Kidney. ? Anchovies. ? Asparagus. ? Herring. ? Mushrooms ? Mussels. ? Beer.  Limit alcohol intake to no more than 1 drink a day for nonpregnant women and 2 drinks a day for men. One drink equals 12 oz of beer, 5 oz of wine, or 1 oz of hard liquor.  Stay at a healthy weight or lose weight if you are overweight. If you want to lose weight, talk with your doctor. It is  important that you do not lose weight too fast.  Start or continue an exercise plan as told by your doctor.  Drink enough fluids to keep your pee clear or pale yellow.  Take over-the-counter and prescription medicines only as told by your doctor.  Keep all follow-up visits as told by your doctor. This is important. Contact a doctor if:  You have another gout attack.  You still have symptoms of a gout attack after10 days of treatment.  You have problems (side effects) because of your medicines.  You have chills or a fever.  You have burning pain when you pee (urinate).  You have pain in your lower back or belly. Get help right away if:  You have very bad pain.  Your pain cannot be controlled.  You cannot pee. This information is not intended to replace advice given to you by your health care provider. Make sure you discuss any questions you have with your health care provider. Document Released: 12/21/2007 Document Revised: 08/19/2015 Document Reviewed: 12/24/2014 Elsevier Interactive Patient Education  [employer]2018 [employer]vier Inc.    I will contact you with your lab results within the next 2 weeks.  If you have not heard from us then please contact us. The fastest way to get your results is to register for My Chart.   IF you received an x-ray today, you  will receive an invoice from [facility]. Please contact [facility] at [phone] with questions or concerns regarding your invoice.   IF you received labwork today, you will receive an invoice from [facility]. Please contact [facility] at [phone] with questions or concerns regarding your invoice.   Our billing staff will not be able to assist you with questions regarding bills from these companies.  You will be contacted with the lab results as soon as they are available. The fastest way to get your results is to activate your My Chart account. Instructions are located on the last page of this paperwork. If  you have not heard from us regarding the results in 2 weeks, please contact this office.

## 2017-11-07 NOTE — Progress Notes (Signed)
    MRN: [id number] DOB: May 03, 1965  Subjective:   Dustin Logan is a 52 y.o. male presenting for 3 day history of recurrent right knee pain, swelling, warmth. Has a history of gout, has been indomethacin with minimal relief. Has had gout before in both feet, his ankles. Has tried to make dietary modifications. Still drinks alcohol however. He does drink 6+ beers 3 times per week.  Denies fever, trauma, falls, bony deformity.  Also has a history of Tourette's syndrome.  Dustin Logan has a current medication list which includes the following prescription(s): allopurinol and indomethacin. Also has No Known Allergies.  Dustin Logan  has a past medical history of Gout attack. Also  has a past surgical history that includes Hernia repair.  Objective:   Vitals: BP (!) 157/111   Pulse 70   Temp 98.5 F (36.9 C) (Oral)   Resp 18   Ht 5' 10.5" (1.791 m)   Wt 217 lb 6.4 oz (98.6 kg)   SpO2 100%   BMI 30.75 kg/m   BP Readings from Last 3 Encounters:  11/07/17 (!) 157/111  08/25/16 124/84  05/31/15 (!) 158/100    Physical Exam  Constitutional: He is oriented to person, place, and time. He appears well-developed and well-nourished.  Cardiovascular: Normal rate.  Pulmonary/Chest: Effort normal.  Musculoskeletal:       Right knee: He exhibits decreased range of motion (full flexion and extension), swelling and erythema. He exhibits no effusion, no ecchymosis, no deformity, no laceration, normal alignment and normal patellar mobility. Tenderness (over area outlined) found. No medial joint line, no lateral joint line and no patellar tendon tenderness noted.       Legs: Neurological: He is alert and oriented to person, place, and time.   Assessment and Plan :   Acute pain of right knee - Plan: Uric acid  Pain and swelling of right knee  Acute gout of right knee, unspecified cause  Tourette's syndrome  Elevated blood pressure reading  Labs pending, counseled patient on alcohol use as a source of his  gout flares.  Will use a steroid course to help his current gout attack.  Return to clinic precautions reviewed.  Follow-up in approximately 2 weeks for recheck on this and his elevated blood pressure reading.  Wallis BambergMario Katrina Daddona, PA-C [facility] at [facility] [phone] 11/07/2017  12:08 PM

## 2017-11-08 LAB — URIC ACID: URIC ACID: 4.8 mg/dL (ref 3.7–8.6)

## 2017-11-29 ENCOUNTER — Other Ambulatory Visit: Payer: Self-pay | Admitting: Family Medicine

## 2017-11-29 DIAGNOSIS — M10071 Idiopathic gout, right ankle and foot: Secondary | ICD-10-CM

## 2017-11-29 NOTE — Telephone Encounter (Signed)
Refill of allopurinol  LOV 11/07/17 M. Mani  [facility] 10/31/17 #60 0 refills  Last Uric acid 11/07/17... 4.8   [id number]  [address]

## 2017-12-26 ENCOUNTER — Other Ambulatory Visit: Payer: Self-pay | Admitting: Urgent Care

## 2017-12-26 DIAGNOSIS — M10071 Idiopathic gout, right ankle and foot: Secondary | ICD-10-CM

## 2018-01-26 ENCOUNTER — Other Ambulatory Visit: Payer: Self-pay | Admitting: Urgent Care

## 2018-01-26 DIAGNOSIS — M10071 Idiopathic gout, right ankle and foot: Secondary | ICD-10-CM

## 2018-02-22 ENCOUNTER — Other Ambulatory Visit: Payer: Self-pay | Admitting: Urgent Care

## 2018-02-22 DIAGNOSIS — M10071 Idiopathic gout, right ankle and foot: Secondary | ICD-10-CM

## 2018-02-22 NOTE — Telephone Encounter (Signed)
Requested medication (s) are due for refill today: No - early  Requested medication (s) are on the active medication list: Yes  Last refill:  01/28/18  Future visit scheduled: No  Notes to clinic:  Pt. Wants to know if he can just follow up in August 2020. Would like 90 day supply. Offered to schedule OV - Refuses.     Requested Prescriptions  Pending Prescriptions Disp Refills   allopurinol (ZYLOPRIM) 100 MG tablet [Pharmacy Med Name: ALLOPURINOL 100 MG TABLET] 60 tablet 0    Sig: TAKE 2 TABLETS BY MOUTH EVERY DAY     Endocrinology:  Gout Agents Failed - 02/22/2018  2:32 AM      Failed - Cr in normal range and within 360 days    Creat  Date Value Ref Range Status  05/31/2015 1.04 0.60 - 1.35 mg/dL Final         Failed - Valid encounter within last 12 months    Recent Outpatient Visits          3 months ago Acute pain of right knee   [facility], EmmettMario, PA-C   1 year ago Acute idiopathic gout of right ankle   [facility], Dema SeverinSarah L, PA-C   2 years ago Acute idiopathic gout of right ankle   [facility] at [facility], [place], [place]   3 years ago Acute idiopathic gout of left knee   [facility], Gwenlyn FoundJessica C, MD   3 years ago Acute gout of right ankle, unspecified cause   [facility] [place], Willifordishira R, PA-C             Passed - Uric Acid in normal range and within 360 days    Uric Acid  Date Value Ref Range Status  11/07/2017 4.8 3.7 - 8.6 mg/dL Final    Comment:               Therapeutic target for gout patients: <6.0

## 2018-03-06 ENCOUNTER — Telehealth: Payer: Self-pay | Admitting: Family Medicine

## 2018-03-06 DIAGNOSIS — M10071 Idiopathic gout, right ankle and foot: Secondary | ICD-10-CM

## 2018-03-06 NOTE — Telephone Encounter (Signed)
Copied from CRM [id number]. Topic: Quick Communication - Rx Refill/Question >> Mar 06, 2018  1:47 PM Jaquita Rectoravis, Karen A wrote: Medication: allopurinol (ZYLOPRIM) 100 MG tablet  Has the patient contacted their pharmacy? Yes.   (Agent: If no, request that the patient contact the pharmacy for the refill.) (Agent: If yes, when and what did the pharmacy advise?)  Preferred Pharmacy (with phone number or street name): [id number] [place] , [place] [address] [phone] (Phone) [phone] (Fax)    Agent: Please be advised that RX refills may take up to 3 business days. We ask that you follow-up with your pharmacy.

## 2018-03-06 NOTE — Telephone Encounter (Signed)
Requested medication (s) are due for refill today -yes  Requested medication (s) are on the active medication list -yes  Future visit scheduled -yes  Last refill: 02/22/18  Notes to clinic: Patient is requesting refill of Rx that has already had courtesy refill. Sent for provider review of request.  Requested Prescriptions  Pending Prescriptions Disp Refills   allopurinol (ZYLOPRIM) 100 MG tablet 15 tablet 0    Sig: Take 2 tablets (200 mg total) by mouth daily.     Endocrinology:  Gout Agents Failed - 03/06/2018  3:25 PM      Failed - Cr in normal range and within 360 days    Creat  Date Value Ref Range Status  05/31/2015 1.04 0.60 - 1.35 mg/dL Final         Failed - Valid encounter within last 12 months    Recent Outpatient Visits          3 months ago Acute pain of right knee   Primary Care at [facility], [place], [place]   1 year ago Acute idiopathic gout of right ankle   [facility] at [facility], Dema Severin, PA-C   2 years ago Acute idiopathic gout of right ankle   Primary Care at [facility], [place], [place]   3 years ago Acute idiopathic gout of left knee   Primary Care at [facility], Gwenlyn Found, MD   3 years ago Acute gout of right ankle, unspecified cause   Primary Care at [facility], Arta Bruce, PA-C      Future Appointments            In 3 weeks Shade Flood, MD Primary Care at [place], [facility]           Passed - Uric Acid in normal range and within 360 days    Uric Acid  Date Value Ref Range Status  11/07/2017 4.8 3.7 - 8.6 mg/dL Final    Comment:               Therapeutic target for gout patients: <6.0          Requested Prescriptions  Pending Prescriptions Disp Refills   allopurinol (ZYLOPRIM) 100 MG tablet 15 tablet 0    Sig: Take 2 tablets (200 mg total) by mouth daily.     Endocrinology:  Gout Agents Failed - 03/06/2018  3:25 PM      Failed - Cr in normal range and within 360 days    Creat  Date Value Ref Range Status  05/31/2015  1.04 0.60 - 1.35 mg/dL Final         Failed - Valid encounter within last 12 months    Recent Outpatient Visits          3 months ago Acute pain of right knee   Primary Care at [facility], [place], [place]   1 year ago Acute idiopathic gout of right ankle   [facility] at [facility], Dema Severin, PA-C   2 years ago Acute idiopathic gout of right ankle   Primary Care at [facility], [place], [place]   3 years ago Acute idiopathic gout of left knee   Primary Care at [facility], Gwenlyn Found, MD   3 years ago Acute gout of right ankle, unspecified cause   Primary Care at [facility], Arta Bruce, PA-C      Future Appointments            In 3 weeks Shade Flood, MD [facility]           Passed - Uric Acid in normal range and within 360 days    Uric Acid  Date Value Ref Range Status  11/07/2017 4.8 3.7 - 8.6 mg/dL Final    Comment:               Therapeutic target for gout patients: <6.0

## 2018-03-09 MED ORDER — ALLOPURINOL 100 MG PO TABS: 200.0000 mg | ORAL_TABLET | Freq: Every day | ORAL | 0 refills | Status: DC

## 2018-03-21 ENCOUNTER — Other Ambulatory Visit: Payer: Self-pay | Admitting: Family Medicine

## 2018-03-21 DIAGNOSIS — M10071 Idiopathic gout, right ankle and foot: Secondary | ICD-10-CM

## 2018-03-21 NOTE — Telephone Encounter (Signed)
Pt called and stated that he has an appointment 03/29/18 and would like to know if he can get enough medication until then. [id number]#[id number]#[phone]

## 2018-03-21 NOTE — Telephone Encounter (Signed)
Courtesy refill; pt has appt 03/29/2018

## 2018-03-29 ENCOUNTER — Ambulatory Visit: Payer: BLUE CROSS/BLUE SHIELD | Admitting: Family Medicine

## 2018-03-29 ENCOUNTER — Other Ambulatory Visit: Payer: Self-pay

## 2018-03-29 ENCOUNTER — Encounter: Payer: Self-pay | Admitting: Family Medicine

## 2018-03-29 VITALS — BP 140/98 | HR 73 | Temp 98.6°F | Ht 71.0 in | Wt 220.0 lb

## 2018-03-29 DIAGNOSIS — R202 Paresthesia of skin: Secondary | ICD-10-CM

## 2018-03-29 DIAGNOSIS — M1A09X0 Idiopathic chronic gout, multiple sites, without tophus (tophi): Secondary | ICD-10-CM | POA: Diagnosis not present

## 2018-03-29 DIAGNOSIS — I10 Essential (primary) hypertension: Secondary | ICD-10-CM | POA: Diagnosis not present

## 2018-03-29 MED ORDER — ALLOPURINOL 100 MG PO TABS: 200.0000 mg | ORAL_TABLET | Freq: Every day | ORAL | 1 refills | Status: DC

## 2018-03-29 MED ORDER — INDOMETHACIN 50 MG PO CAPS: ORAL_CAPSULE | ORAL | 0 refills | Status: DC

## 2018-03-29 MED ORDER — AMLODIPINE BESYLATE 5 MG PO TABS: 5.0000 mg | ORAL_TABLET | Freq: Every day | ORAL | 3 refills | Status: DC

## 2018-03-29 NOTE — Patient Instructions (Addendum)
   If you have lab work done today you will be contacted with your lab results within the next 2 weeks.  If you have not heard from us then please contact us. The fastest way to get your results is to register for My Chart.   IF you received an x-ray today, you will receive an invoice from [facility]. Please contact  Radiology at [phone] with questions or concerns regarding your invoice.   IF you received labwork today, you will receive an invoice from [facility]. Please contact [facility] at [phone] with questions or concerns regarding your invoice.   Our billing staff will not be able to assist you with questions regarding bills from these companies.  You will be contacted with the lab results as soon as they are available. The fastest way to get your results is to activate your My Chart account. Instructions are located on the last page of this paperwork. If you have not heard from us regarding the results in 2 weeks, please contact this office.     DASH Eating Plan DASH stands for "Dietary Approaches to Stop Hypertension." The DASH eating plan is a healthy eating plan that has been shown to reduce high blood pressure (hypertension). It may also reduce your risk for type 2 diabetes, heart disease, and stroke. The DASH eating plan may also help with weight loss. What are tips for following this plan?  General guidelines  Avoid eating more than 2,300 mg (milligrams) of salt (sodium) a day. If you have hypertension, you may need to reduce your sodium intake to 1,500 mg a day.  Limit alcohol intake to no more than 1 drink a day for nonpregnant women and 2 drinks a day for men. One drink equals 12 oz of beer, 5 oz of wine, or 1 oz of hard liquor.  Work with your health care provider to maintain a healthy body weight or to lose weight. Ask what an ideal weight is for you.  Get at least 30 minutes of exercise that causes your heart to beat faster (aerobic  exercise) most days of the week. Activities may include walking, swimming, or biking.  Work with your health care provider or diet and nutrition specialist (dietitian) to adjust your eating plan to your individual calorie needs. Reading food labels   Check food labels for the amount of sodium per serving. Choose foods with less than 5 percent of the Daily Value of sodium. Generally, foods with less than 300 mg of sodium per serving fit into this eating plan.  To find whole grains, look for the word "whole" as the first word in the ingredient list. Shopping  Buy products labeled as "low-sodium" or "no salt added."  Buy fresh foods. Avoid canned foods and premade or frozen meals. Cooking  Avoid adding salt when cooking. Use salt-free seasonings or herbs instead of table salt or sea salt. Check with your health care provider or pharmacist before using salt substitutes.  Do not fry foods. Cook foods using healthy methods such as baking, boiling, grilling, and broiling instead.  Cook with heart-healthy oils, such as olive, canola, soybean, or sunflower oil. Meal planning  Eat a balanced diet that includes: ? 5 or more servings of fruits and vegetables each day. At each meal, try to fill half of your plate with fruits and vegetables. ? Up to 6-8 servings of whole grains each day. ? Less than 6 oz of lean meat, poultry, or fish each day. A 3-oz serving   of meat is about the same size as a deck of cards. One egg equals 1 oz. ? 2 servings of low-fat dairy each day. ? A serving of nuts, seeds, or beans 5 times each week. ? Heart-healthy fats. Healthy fats called Omega-3 fatty acids are found in foods such as flaxseeds and coldwater fish, like sardines, salmon, and mackerel.  Limit how much you eat of the following: ? Canned or prepackaged foods. ? Food that is high in trans fat, such as fried foods. ? Food that is high in saturated fat, such as fatty meat. ? Sweets, desserts, sugary drinks,  and other foods with added sugar. ? Full-fat dairy products.  Do not salt foods before eating.  Try to eat at least 2 vegetarian meals each week.  Eat more home-cooked food and less restaurant, buffet, and fast food.  When eating at a restaurant, ask that your food be prepared with less salt or no salt, if possible. What foods are recommended? The items listed may not be a complete list. Talk with your dietitian about what dietary choices are best for you. Grains Whole-grain or whole-wheat bread. Whole-grain or whole-wheat pasta. Brown rice. Oatmeal. Quinoa. Bulgur. Whole-grain and low-sodium cereals. Pita bread. Low-fat, low-sodium crackers. Whole-wheat flour tortillas. Vegetables Fresh or frozen vegetables (raw, steamed, roasted, or grilled). Low-sodium or reduced-sodium tomato and vegetable juice. Low-sodium or reduced-sodium tomato sauce and tomato paste. Low-sodium or reduced-sodium canned vegetables. Fruits All fresh, dried, or frozen fruit. Canned fruit in natural juice (without added sugar). Meat and other protein foods Skinless chicken or turkey. Ground chicken or turkey. Pork with fat trimmed off. Fish and seafood. Egg whites. Dried beans, peas, or lentils. Unsalted nuts, nut butters, and seeds. Unsalted canned beans. Lean cuts of beef with fat trimmed off. Low-sodium, lean deli meat. Dairy Low-fat (1%) or fat-free (skim) milk. Fat-free, low-fat, or reduced-fat cheeses. Nonfat, low-sodium ricotta or cottage cheese. Low-fat or nonfat yogurt. Low-fat, low-sodium cheese. Fats and oils Soft margarine without trans fats. Vegetable oil. Low-fat, reduced-fat, or light mayonnaise and salad dressings (reduced-sodium). Canola, safflower, olive, soybean, and sunflower oils. Avocado. Seasoning and other foods Herbs. Spices. Seasoning mixes without salt. Unsalted popcorn and pretzels. Fat-free sweets. What foods are not recommended? The items listed may not be a complete list. Talk with your  dietitian about what dietary choices are best for you. Grains Baked goods made with fat, such as croissants, muffins, or some breads. Dry pasta or rice meal packs. Vegetables Creamed or fried vegetables. Vegetables in a cheese sauce. Regular canned vegetables (not low-sodium or reduced-sodium). Regular canned tomato sauce and paste (not low-sodium or reduced-sodium). Regular tomato and vegetable juice (not low-sodium or reduced-sodium). Pickles. Olives. Fruits Canned fruit in a light or heavy syrup. Fried fruit. Fruit in cream or butter sauce. Meat and other protein foods Fatty cuts of meat. Ribs. Fried meat. Bacon. Sausage. Bologna and other processed lunch meats. Salami. Fatback. Hotdogs. Bratwurst. Salted nuts and seeds. Canned beans with added salt. Canned or smoked fish. Whole eggs or egg yolks. Chicken or turkey with skin. Dairy Whole or 2% milk, cream, and half-and-half. Whole or full-fat cream cheese. Whole-fat or sweetened yogurt. Full-fat cheese. Nondairy creamers. Whipped toppings. Processed cheese and cheese spreads. Fats and oils Butter. Stick margarine. Lard. Shortening. Ghee. Bacon fat. Tropical oils, such as coconut, palm kernel, or palm oil. Seasoning and other foods Salted popcorn and pretzels. Onion salt, garlic salt, seasoned salt, table salt, and sea salt. Worcestershire sauce. Tartar sauce. Barbecue sauce.   Teriyaki sauce. Soy sauce, including reduced-sodium. Steak sauce. Canned and packaged gravies. Fish sauce. Oyster sauce. Cocktail sauce. Horseradish that you find on the shelf. Ketchup. Mustard. Meat flavorings and tenderizers. Bouillon cubes. Hot sauce and Tabasco sauce. Premade or packaged marinades. Premade or packaged taco seasonings. Relishes. Regular salad dressings. Where to find more information:  National Heart, Lung, and Blood Institute: www.nhlbi.nih.gov  American Heart Association: www.heart.org Summary  The DASH eating plan is a healthy eating plan that has  been shown to reduce high blood pressure (hypertension). It may also reduce your risk for type 2 diabetes, heart disease, and stroke.  With the DASH eating plan, you should limit salt (sodium) intake to 2,300 mg a day. If you have hypertension, you may need to reduce your sodium intake to 1,500 mg a day.  When on the DASH eating plan, aim to eat more fresh fruits and vegetables, whole grains, lean proteins, low-fat dairy, and heart-healthy fats.  Work with your health care provider or diet and nutrition specialist (dietitian) to adjust your eating plan to your individual calorie needs. This information is not intended to replace advice given to you by your health care provider. Make sure you discuss any questions you have with your health care provider. Document Released: 03/02/2011 Document Revised: 03/06/2016 Document Reviewed: 03/06/2016 Elsevier Interactive Patient Education  2019 Elsevier Inc.  

## 2018-03-29 NOTE — Progress Notes (Signed)
1/3/20209:22 AM  Dustin Logan Mar 01, 1966, 53 y.o. male [id number]  Chief Complaint  Patient presents with  . Medication Refill    allopurinol & indomethacin  . Gout    f/u     HPI:   Patient is a 53 y.o. male with past medical history significant for gout who presents today for medication refill  Longstanding gout Most commonly affected joints knees and toes Last gout flare up around august 2019, had not had one in a long time Usually well controlled on allopurinol, takes indomethacin for flareup Has physically active job Has gained weight, high salt diet Has cut back on drinking significantly, no more than 2 drinks at a time, socially  He has been having intermittent numbness tingling start in right shoulder and go down his arm, last about 10-15 seconds, seems to be provoked by elevating arm, no weakness   Fall Risk  03/29/2018 08/25/2016  Falls in the past year? 0 No     [facility] 03/29/2018 08/25/2016 05/31/2015  Decreased Interest 0 0 0  Down, Depressed, Hopeless 0 0 0  PHQ - 2 Score 0 0 0    No Known Allergies  Prior to Admission medications   Medication Sig Start Date End Date Taking? Authorizing Provider  allopurinol (ZYLOPRIM) 100 MG tablet TAKE 2 TABLETS (200 MG TOTAL) BY MOUTH DAILY. PLEASE KEEP APPT FOR FURTHER REFILLS. 03/21/18  Yes Shade FloodGreene, Jeffrey R, MD  cyclobenzaprine (FLEXERIL) 10 MG tablet Take 1 tablet (10 mg total) by mouth at bedtime. Needs office visit 01/29/18  Yes Collie SiadStallings, Zoe A, MD  indomethacin (INDOCIN) 50 MG capsule TAKE 1 CAPSULE BY MOUTH TWICE DAILY WITH A MEAL AS NEEDED FOR GOUT 07/31/17  Yes Shade FloodGreene, Jeffrey R, MD    Past Medical History:  Diagnosis Date  . Gout attack     Past Surgical History:  Procedure Laterality Date  . HERNIA REPAIR      Social History   Tobacco Use  . Smoking status: Current Some Day Smoker    Types: Cigars  . Smokeless tobacco: Never Used  . Tobacco comment: smokes 10 cigars in a week    Substance Use Topics  . Alcohol use: Yes    Alcohol/week: 12.0 standard drinks    Types: 12 Standard drinks or equivalent per week    Family History  Problem Relation Age of Onset  . Dementia Father     Review of Systems  Constitutional: Negative for chills and fever.  Respiratory: Negative for cough and shortness of breath.   Cardiovascular: Negative for chest pain, palpitations and leg swelling.  Gastrointestinal: Negative for abdominal pain, nausea and vomiting.    OBJECTIVE:  Blood pressure (!) 150/108, pulse 73, temperature 98.6 F (37 C), temperature source Oral, height 5\' 11"  (1.803 m), weight 220 lb (99.8 kg), SpO2 98 %. Body mass index is 30.68 kg/m.   Repeat 140/98  BP Readings from Last 3 Encounters:  03/29/18 (!) 150/108  11/07/17 (!) 157/111  08/25/16 124/84   Wt Readings from Last 3 Encounters:  03/29/18 220 lb (99.8 kg)  11/07/17 217 lb 6.4 oz (98.6 kg)  08/25/16 208 lb (94.3 kg)    Physical Exam Vitals signs and nursing note reviewed.  Constitutional:      Appearance: He is well-developed.  HENT:     Head: Normocephalic and atraumatic.  Eyes:     Conjunctiva/sclera: Conjunctivae normal.     Pupils: Pupils are equal, round, and reactive to light.  Neck:  Musculoskeletal: Full passive range of motion without pain.  Cardiovascular:     Rate and Rhythm: Normal rate and regular rhythm.     Pulses: Normal pulses.     Heart sounds: No murmur. No friction rub. No gallop.   Pulmonary:     Effort: Pulmonary effort is normal.     Breath sounds: Normal breath sounds. No wheezing or rales.  Musculoskeletal:     Right shoulder: Normal.     Right elbow: Normal.    Right wrist: Normal.  Skin:    General: Skin is warm and dry.  Neurological:     Mental Status: He is alert and oriented to person, place, and time.     Sensory: Sensation is intact.     Motor: Motor function is intact.     Deep Tendon Reflexes: Reflexes are normal and symmetric.      ASSESSMENT and PLAN  1. Idiopathic chronic gout of multiple sites without tophus Controlled. Continue current regime.  - Uric acid - Basic Metabolic Panel - allopurinol (ZYLOPRIM) 100 MG tablet; Take 2 tablets (200 mg total) by mouth daily. Please keep appt for further refills. - indomethacin (INDOCIN) 50 MG capsule; TAKE 1 CAPSULE BY MOUTH TWICE DAILY WITH A MEAL AS NEEDED FOR GOUT  2. Essential hypertension, benign New diagnosis. Starting amlodipine, reviewed r/se/b. Discussed LFM  3. Paresthesia of right upper extremity Unremarkable exam, short lived for now, cont to monitor. TOS?   Other orders - amLODipine (NORVASC) 5 MG tablet; Take 1 tablet (5 mg total) by mouth daily.  Return in about 3 months (around 06/28/2018) for HTN and paresthesia.    Myles Lipps, MD Primary Care at [facility] [address] [place], [place] [address] Ph.  [phone] Fax [phone]

## 2018-03-30 LAB — BASIC METABOLIC PANEL
BUN/Creatinine Ratio: 13 (ref 9–20)
BUN: 12 mg/dL (ref 6–24)
CO2: 18 mmol/L — ABNORMAL LOW (ref 20–29)
Calcium: 9.5 mg/dL (ref 8.7–10.2)
Chloride: 105 mmol/L (ref 96–106)
Creatinine, Ser: 0.94 mg/dL (ref 0.76–1.27)
GFR calc Af Amer: 108 mL/min/{1.73_m2} (ref >59)
GFR calc non Af Amer: 93 mL/min/{1.73_m2} (ref >59)
Glucose: 109 mg/dL — ABNORMAL HIGH (ref 65–99)
Potassium: 4.3 mmol/L (ref 3.5–5.2)
Sodium: 139 mmol/L (ref 134–144)

## 2018-03-30 LAB — URIC ACID: Uric Acid: 5.8 mg/dL (ref 3.7–8.6)

## 2018-04-22 ENCOUNTER — Other Ambulatory Visit: Payer: Self-pay | Admitting: Family Medicine

## 2018-04-22 DIAGNOSIS — M1A09X0 Idiopathic chronic gout, multiple sites, without tophus (tophi): Secondary | ICD-10-CM

## 2018-04-22 NOTE — Telephone Encounter (Signed)
LR  03/29/2018 for 90 tabs NOV  06/26/2018 Requested Prescriptions  Refused Prescriptions Disp Refills  . indomethacin (INDOCIN) 50 MG capsule [Pharmacy Med Name: INDOMETHACIN 50 MG CAPSULE] 60 capsule 1    Sig: TAKE 1 CAPSULE BY MOUTH TWICE DAILY WITH A MEAL AS NEEDED FOR GOUT     Analgesics:  NSAIDS Failed - 04/22/2018  2:12 AM      Failed - HGB in normal range and within 360 days    Hemoglobin  Date Value Ref Range Status  01/04/2015 14.3 14.1 - 18.1 g/dL Final         Passed - Cr in normal range and within 360 days    Creat  Date Value Ref Range Status  05/31/2015 1.04 0.60 - 1.35 mg/dL Final   Creatinine, Ser  Date Value Ref Range Status  03/29/2018 0.94 0.76 - 1.27 mg/dL Final         Passed - Patient is not pregnant      Passed - Valid encounter within last 12 months    Recent Outpatient Visits          3 weeks ago Idiopathic chronic gout of multiple sites without tophus   [facility], Meda Coffee, MD   5 months ago Acute pain of right knee   Primary Care at [facility], [place], PA-C   1 year ago Acute idiopathic gout of right ankle   [facility], Dema Severin, PA-C   2 years ago Acute idiopathic gout of right ankle   Primary Care at [facility], [place], [place]   3 years ago Acute idiopathic gout of left knee   [facility] at [facility], Gwenlyn Found, MD      Future Appointments            In 2 months Myles Lipps, MD Primary Care at [place], [facility]

## 2018-05-15 ENCOUNTER — Telehealth: Payer: Self-pay | Admitting: Family Medicine

## 2018-05-15 NOTE — Telephone Encounter (Signed)
LVM for pt regarding appt scheduled with Dr. Leretha Pol on 4/1. Due to [place] being out of the office that day, pt will need to be rescheduled. When pt calls back, please reschedule appt from that day at their convenience. Thank you!

## 2018-06-21 ENCOUNTER — Other Ambulatory Visit: Payer: Self-pay

## 2018-06-21 DIAGNOSIS — I10 Essential (primary) hypertension: Secondary | ICD-10-CM

## 2018-06-26 ENCOUNTER — Ambulatory Visit: Payer: BLUE CROSS/BLUE SHIELD | Admitting: Family Medicine

## 2018-06-27 ENCOUNTER — Other Ambulatory Visit: Payer: Self-pay

## 2018-06-27 ENCOUNTER — Telehealth (INDEPENDENT_AMBULATORY_CARE_PROVIDER_SITE_OTHER): Payer: BLUE CROSS/BLUE SHIELD | Admitting: Family Medicine

## 2018-06-27 DIAGNOSIS — M10071 Idiopathic gout, right ankle and foot: Secondary | ICD-10-CM

## 2018-06-27 DIAGNOSIS — I10 Essential (primary) hypertension: Secondary | ICD-10-CM

## 2018-06-27 DIAGNOSIS — M1A09X0 Idiopathic chronic gout, multiple sites, without tophus (tophi): Secondary | ICD-10-CM

## 2018-06-27 DIAGNOSIS — Z125 Encounter for screening for malignant neoplasm of prostate: Secondary | ICD-10-CM

## 2018-06-27 MED ORDER — ALLOPURINOL 100 MG PO TABS: 200.0000 mg | ORAL_TABLET | Freq: Every day | ORAL | 1 refills | Status: DC

## 2018-06-27 MED ORDER — AMLODIPINE BESYLATE 5 MG PO TABS: 5.0000 mg | ORAL_TABLET | Freq: Every day | ORAL | 3 refills | Status: DC

## 2018-06-27 NOTE — Progress Notes (Signed)
Virtual Visit via telephone Note  I connected with patient on 06/27/18 at 913am by telephone and verified that I am speaking with the correct person using two identifiers. Dustin Logan is currently located at home and patient is currently with her during visit. The provider, [name], MD is located in their office at time of visit.  I discussed the limitations, risks, security and privacy concerns of performing an evaluation and management service by telephone and the availability of in person appointments. I also discussed with the patient that there may be a patient responsible charge related to this service. The patient expressed understanding and agreed to proceed.  Telephone visit today for BP and gout followup  HPI Last OV Jan 2020 Started amlodipine '5mg'$  - tolerating well Has significantly decreased salt intake Drinking plenty of water Denies chest pain, edema, dizziness, SOB Denies any issues with gout - following diet, taking meds allopurinol Does not check BP at home  Lab Results  Component Value Date   LABURIC 5.8 03/29/2018   Lab Results  Component Value Date   CREATININE 0.94 03/29/2018   BUN 12 03/29/2018   NA 139 03/29/2018   K 4.3 03/29/2018   CL 105 03/29/2018   CO2 18 (L) 03/29/2018    Fall Risk  06/27/2018 03/29/2018 08/25/2016  Falls in the past year? 0 0 No  Number falls in past yr: 0 - -  Injury with Fall? 0 - -     Depression screen [facility] 2/9 06/27/2018 03/29/2018 08/25/2016  Decreased Interest 0 0 0  Down, Depressed, Hopeless 0 0 0  PHQ - 2 Score 0 0 0    No Known Allergies  Prior to Admission medications   Medication Sig Start Date End Date Taking? Authorizing Provider  allopurinol (ZYLOPRIM) 100 MG tablet Take 2 tablets (200 mg total) by mouth daily. Please keep appt for further refills. 03/29/18   [name], MD  amLODipine (NORVASC) 5 MG tablet Take 1 tablet (5 mg total) by mouth daily. 03/29/18   [name], MD  cyclobenzaprine  (FLEXERIL) 10 MG tablet Take 1 tablet (10 mg total) by mouth at bedtime. Needs office visit 01/29/18   [name], MD  indomethacin (INDOCIN) 50 MG capsule TAKE 1 CAPSULE BY MOUTH TWICE DAILY WITH A MEAL AS NEEDED FOR GOUT 03/29/18   [name], MD    Past Medical History:  Diagnosis Date  . Gout attack     Past Surgical History:  Procedure Laterality Date  . HERNIA REPAIR      Social History   Tobacco Use  . Smoking status: Current Some Day Smoker    Types: Cigars  . Smokeless tobacco: Never Used  . Tobacco comment: smokes 10 cigars in a week  Substance Use Topics  . Alcohol use: Yes    Alcohol/week: 12.0 standard drinks    Types: 12 Standard drinks or equivalent per week    Family History  Problem Relation Age of Onset  . Dementia Father     ROS Per hpi Objective  Vitals as reported by the patient: none  There were no vitals filed for this visit.  ASSESSMENT and PLAN  1. Essential hypertension, benign Tolerating medication well. Have no readings to assess control. Will continue with med for now, cont with LFM. Patient's goal is to work on LFM in order to be off meds. - CMP14+EGFR - Lipid panel  2. Screening for prostate cancer - PSA  3. Idiopathic  chronic gout of multiple sites without tophus Controlled. Continue current regime.  - Uric Acid - allopurinol (ZYLOPRIM) 100 MG tablet; Take 2 tablets (200 mg total) by mouth daily.  Other orders - amLODipine (NORVASC) 5 MG tablet; Take 1 tablet (5 mg total) by mouth daily.  FOLLOW-UP: 3 months   The above assessment and management plan was discussed with the patient. The patient verbalized understanding of and has agreed to the management plan. Patient is aware to call the clinic if symptoms persist or worsen. Patient is aware when to return to the clinic for a follow-up visit. Patient educated on when it is appropriate to go to the emergency department.    I provided 12 minutes of non-face-to-face  time during this encounter.  [name], MD Primary Care at [facility] [place], [place] [address] Ph.  [phone] Fax [phone]

## 2018-06-27 NOTE — Progress Notes (Signed)
Pt says he has been doing very well. Not eating as high salt foods as he was in the past. The gout meds he is on are going very well for him. No new medical issue this morning. No issue with anxiety or depression. Needs refill on his gout meds

## 2018-06-28 LAB — PSA: Prostate Specific Ag, Serum: 0.6 ng/mL (ref 0.0–4.0)

## 2018-06-28 LAB — CMP14+EGFR
ALT: 39 IU/L (ref 0–44)
AST: 36 IU/L (ref 0–40)
Albumin/Globulin Ratio: 1.9 (ref 1.2–2.2)
Albumin: 4.3 g/dL (ref 3.8–4.9)
Alkaline Phosphatase: 78 IU/L (ref 39–117)
BUN/Creatinine Ratio: 14 (ref 9–20)
BUN: 13 mg/dL (ref 6–24)
Bilirubin Total: 0.4 mg/dL (ref 0.0–1.2)
CO2: 21 mmol/L (ref 20–29)
Calcium: 9.6 mg/dL (ref 8.7–10.2)
Chloride: 104 mmol/L (ref 96–106)
Creatinine, Ser: 0.95 mg/dL (ref 0.76–1.27)
GFR calc Af Amer: 107 mL/min/{1.73_m2} (ref >59)
GFR calc non Af Amer: 92 mL/min/{1.73_m2} (ref >59)
Globulin, Total: 2.3 g/dL (ref 1.5–4.5)
Glucose: 94 mg/dL (ref 65–99)
Potassium: 4.1 mmol/L (ref 3.5–5.2)
Sodium: 141 mmol/L (ref 134–144)
Total Protein: 6.6 g/dL (ref 6.0–8.5)

## 2018-06-28 LAB — URIC ACID: Uric Acid: 5.4 mg/dL (ref 3.7–8.6)

## 2018-06-28 LAB — LIPID PANEL
Chol/HDL Ratio: 4.6 ratio (ref 0.0–5.0)
Cholesterol, Total: 220 mg/dL — ABNORMAL HIGH (ref 100–199)
HDL: 48 mg/dL (ref >39)
LDL Calculated: 106 mg/dL — ABNORMAL HIGH (ref 0–99)
Triglycerides: 329 mg/dL — ABNORMAL HIGH (ref 0–149)
VLDL Cholesterol Cal: 66 mg/dL — ABNORMAL HIGH (ref 5–40)

## 2018-09-25 ENCOUNTER — Ambulatory Visit: Payer: BLUE CROSS/BLUE SHIELD | Admitting: Family Medicine

## 2018-09-26 ENCOUNTER — Other Ambulatory Visit: Payer: Self-pay

## 2018-09-26 ENCOUNTER — Telehealth (INDEPENDENT_AMBULATORY_CARE_PROVIDER_SITE_OTHER): Payer: BC Managed Care – PPO | Admitting: Family Medicine

## 2018-09-26 DIAGNOSIS — Z7289 Other problems related to lifestyle: Secondary | ICD-10-CM | POA: Diagnosis not present

## 2018-09-26 DIAGNOSIS — G4700 Insomnia, unspecified: Secondary | ICD-10-CM | POA: Diagnosis not present

## 2018-09-26 DIAGNOSIS — M1A09X0 Idiopathic chronic gout, multiple sites, without tophus (tophi): Secondary | ICD-10-CM | POA: Diagnosis not present

## 2018-09-26 DIAGNOSIS — I10 Essential (primary) hypertension: Secondary | ICD-10-CM

## 2018-09-26 DIAGNOSIS — Z789 Other specified health status: Secondary | ICD-10-CM

## 2018-09-26 MED ORDER — AMLODIPINE BESYLATE 5 MG PO TABS: 5.0000 mg | ORAL_TABLET | Freq: Every day | ORAL | 1 refills | Status: DC

## 2018-09-26 MED ORDER — ALLOPURINOL 100 MG PO TABS: 200.0000 mg | ORAL_TABLET | Freq: Every day | ORAL | 0 refills | Status: DC

## 2018-09-26 NOTE — Progress Notes (Signed)
Virtual Visit via Telephone Note  I connected with Dustin Logan on 09/26/18 at 9:46 AM by telephone and verified that I am speaking with the correct person using two identifiers.   I discussed the limitations, risks, security and privacy concerns of performing an evaluation and management service by telephone and the availability of in person appointments. I also discussed with the patient that there may be a patient responsible charge related to this service. The patient expressed understanding and agreed to proceed, consent obtained  Chief complaint: Follow up gout, HTN.   History of Present Illness: Dustin Logan is a 53 y.o. male  Hypertension: BP Readings from Last 3 Encounters:  03/29/18 (!) 140/98  11/07/17 (!) 157/111  08/25/16 124/84   Lab Results  Component Value Date   CREATININE 0.95 06/27/2018  Started on amlodipine 5 mg in January.  Telemedicine visit with Dr. Leretha PolSantiago April 2.  Had decreased salt intake at that time, no new symptoms.  No home readings at that time. Home readings: none. Plans to have BP checked at [facility].  Still avoiding salt intake - very minimal use.  No new side effects with meds.  Constitutional: Negative for fatigue and unexpected weight change.  Eyes: Negative for visual disturbance.  Respiratory: Negative for cough, chest tightness and shortness of breath.   Cardiovascular: Negative for chest pain, palpitations and leg swelling.  Gastrointestinal: Negative for abdominal pain and blood in stool.  Neurological: Negative for dizziness, light-headedness and headaches.   Gout: Lab Results  Component Value Date   LABURIC 5.4 06/27/2018  Controlled when discussed April 2.  Was continued on allopurinol 200 mg daily with indomethacin if needed. Last significant flare of gout - unknown.  Has minor symptoms and takes indomethacin - only once every few months.   Health maintenance: Due for HIV screening, Tdap, colonoscopy - notes he had  colonoscopy about 7 years ago. Had done by Dr. Elnoria HowardHung.  Plans on tdap, HIV screening at next in person appointment.   Sleep issue: Occasional difficulty getting to sleep.  Wakes up in middle of night at times and trouble getting back to sleep. Has taken flexeril in past but that does not work.  2-3 days per week. Watches tv at bedtime at times.  Has tried various otc meds - did not seem to work, as next am felt groggy.  Did not feel like melatonin worked.  Does drink tea, soft drinks- including with dinner.  Alcohol: 2-3 times per week, 5-7 beers plus few shots of liquor.  Denies addiction, IDU,  Does snore. No prior hx of OSA. No known pauses. Feels rested during the day usually.     Patient Active Problem List   Diagnosis Date Noted  . Essential hypertension, benign 03/29/2018  . Tourette's syndrome 06/01/2015  . Gout 10/03/2013   Past Medical History:  Diagnosis Date  . Gout attack    Past Surgical History:  Procedure Laterality Date  . HERNIA REPAIR     No Known Allergies Prior to Admission medications   Medication Sig Start Date End Date Taking? Authorizing Provider  allopurinol (ZYLOPRIM) 100 MG tablet Take 2 tablets (200 mg total) by mouth daily. 06/27/18   Myles LippsSantiago, Irma M, MD  amLODipine (NORVASC) 5 MG tablet Take 1 tablet (5 mg total) by mouth daily. 06/27/18   Myles LippsSantiago, Irma M, MD  cyclobenzaprine (FLEXERIL) 10 MG tablet Take 1 tablet (10 mg total) by mouth at bedtime. Needs office visit 01/29/18   Collie SiadStallings, Zoe  A, MD  indomethacin (INDOCIN) 50 MG capsule TAKE 1 CAPSULE BY MOUTH TWICE DAILY WITH A MEAL AS NEEDED FOR GOUT 03/29/18   Myles LippsSantiago, Irma M, MD   Social History   Socioeconomic History  . Marital status: Married    Spouse name: Not on file  . Number of children: Not on file  . Years of education: Not on file  . Highest education level: Not on file  Occupational History  . Not on file  Social Needs  . Financial resource strain: Not on file  . Food insecurity     Worry: Not on file    Inability: Not on file  . Transportation needs    Medical: Not on file    Non-medical: Not on file  Tobacco Use  . Smoking status: Current Some Day Smoker    Types: Cigars  . Smokeless tobacco: Never Used  . Tobacco comment: smokes 10 cigars in a week  Substance and Sexual Activity  . Alcohol use: Yes    Alcohol/week: 12.0 standard drinks    Types: 12 Standard drinks or equivalent per week  . Drug use: No  . Sexual activity: Not on file  Lifestyle  . Physical activity    Days per week: Not on file    Minutes per session: Not on file  . Stress: Not on file  Relationships  . Social Musicianconnections    Talks on phone: Not on file    Gets together: Not on file    Attends religious service: Not on file    Active member of club or organization: Not on file    Attends meetings of clubs or organizations: Not on file    Relationship status: Not on file  . Intimate partner violence    Fear of current or ex partner: Not on file    Emotionally abused: Not on file    Physically abused: Not on file    Forced sexual activity: Not on file  Other Topics Concern  . Not on file  Social History Narrative  . Not on file     Observations/Objective: No distress, appropriate responses.  All questions answered, understanding expressed. No home vitals obtained.   Assessment and Plan: Essential hypertension - Plan: amLODipine (NORVASC) 5 MG tablet,   -Tolerating meds.  Commended on decrease sodium intake.  Also advised to cut back on alcohol intake and potential impact on blood pressure.  Continue amlodipine for now, check on readings with update through my chart or telephone message, and recheck in office in 1 month  Idiopathic chronic gout of multiple sites without tophus - Plan: allopurinol (ZYLOPRIM) 100 MG tablet,   -Stable with allopurinol 200 mg daily, indomethacin only as needed for flares.  Insomnia, unspecified type - Plan:   -Possibly multifactorial including  use of caffeine, alcohol, some snoring but denies daytime somnolence/pauses.  May have psychological component as well.  -Decrease caffeine use and avoid after lunch, decrease alcohol use, other sleep hygiene reviewed.   - Restart melatonin, handout to be printed with information on insomnia and sent to patient.  Recheck within the next 1 month, sooner if worse.  Alcohol use - Plan:   -Denies addiction or DUI/legal issues.  Advised to cut back to no more than 1-2 drinks per day with impact on sleep and blood pressure discussed.  Plan on Tdap, HIV testing at next in person visit.  Does report being up-to-date on colonoscopy, will obtain records from previous gastroenterologist   Follow Up Instructions:  1 month  Patient Instructions  Keep a record of your blood pressures outside of the office and call me or send me an email about those readings. Continue amlodipine same dose for now.   No change in gout meds for now.   tdap vaccine and HIV testing can be performed.   Cut back on caffeine - especially after lunch. No caffeine at dinner.  Decrease alcohol to no more than 2 drinks per day.  Restart melatonin each night.  If any pauses in breathing with snoring, let me know as you may need a sleep study.  See other insomnia information.  Follow up in 1 month in office and can recheck blood pressure and talk about sleep further.   Return to the clinic or go to the nearest emergency room if any of your symptoms worsen or new symptoms occur.    Insomnia Insomnia is a sleep disorder that makes it difficult to fall asleep or stay asleep. Insomnia can cause fatigue, low energy, difficulty concentrating, mood swings, and poor performance at work or school. There are three different ways to classify insomnia:  Difficulty falling asleep.  Difficulty staying asleep.  Waking up too early in the morning. Any type of insomnia can be long-term (chronic) or short-term (acute). Both are common.  Short-term insomnia usually lasts for three months or less. Chronic insomnia occurs at least three times a week for longer than three months. What are the causes? Insomnia may be caused by another condition, situation, or substance, such as:  Anxiety.  Certain medicines.  Gastroesophageal reflux disease (GERD) or other gastrointestinal conditions.  Asthma or other breathing conditions.  Restless legs syndrome, sleep apnea, or other sleep disorders.  Chronic pain.  Menopause.  Stroke.  Abuse of alcohol, tobacco, or illegal drugs.  Mental health conditions, such as depression.  Caffeine.  Neurological disorders, such as Alzheimer's disease.  An overactive thyroid (hyperthyroidism). Sometimes, the cause of insomnia may not be known. What increases the risk? Risk factors for insomnia include:  Gender. Women are affected more often than men.  Age. Insomnia is more common as you get older.  Stress.  Lack of exercise.  Irregular work schedule or working night shifts.  Traveling between different time zones.  Certain medical and mental health conditions. What are the signs or symptoms? If you have insomnia, the main symptom is having trouble falling asleep or having trouble staying asleep. This may lead to other symptoms, such as:  Feeling fatigued or having low energy.  Feeling nervous about going to sleep.  Not feeling rested in the morning.  Having trouble concentrating.  Feeling irritable, anxious, or depressed. How is this diagnosed? This condition may be diagnosed based on:  Your symptoms and medical history. Your health care provider may ask about: ? Your sleep habits. ? Any medical conditions you have. ? Your mental health.  A physical exam. How is this treated? Treatment for insomnia depends on the cause. Treatment may focus on treating an underlying condition that is causing insomnia. Treatment may also include:  Medicines to help you sleep.   Counseling or therapy.  Lifestyle adjustments to help you sleep better. Follow these instructions at home: Eating and drinking   Limit or avoid alcohol, caffeinated beverages, and cigarettes, especially close to bedtime. These can disrupt your sleep.  Do not eat a large meal or eat spicy foods right before bedtime. This can lead to digestive discomfort that can make it hard for you to sleep. Sleep habits  Keep a sleep diary to help you and your health care provider figure out what could be causing your insomnia. Write down: ? When you sleep. ? When you wake up during the night. ? How well you sleep. ? How rested you feel the next day. ? Any side effects of medicines you are taking. ? What you eat and drink.  Make your bedroom a dark, comfortable place where it is easy to fall asleep. ? Put up shades or blackout curtains to block light from outside. ? Use a white noise machine to block noise. ? Keep the temperature cool.  Limit screen use before bedtime. This includes: ? Watching TV. ? Using your smartphone, tablet, or computer.  Stick to a routine that includes going to bed and waking up at the same times every day and night. This can help you fall asleep faster. Consider making a quiet activity, such as reading, part of your nighttime routine.  Try to avoid taking naps during the day so that you sleep better at night.  Get out of bed if you are still awake after 15 minutes of trying to sleep. Keep the lights down, but try reading or doing a quiet activity. When you feel sleepy, go back to bed. General instructions  Take over-the-counter and prescription medicines only as told by your health care provider.  Exercise regularly, as told by your health care provider. Avoid exercise starting several hours before bedtime.  Use relaxation techniques to manage stress. Ask your health care provider to suggest some techniques that may work well for you. These may include: ?  Breathing exercises. ? Routines to release muscle tension. ? Visualizing peaceful scenes.  Make sure that you drive carefully. Avoid driving if you feel very sleepy.  Keep all follow-up visits as told by your health care provider. This is important. Contact a health care provider if:  You are tired throughout the day.  You have trouble in your daily routine due to sleepiness.  You continue to have sleep problems, or your sleep problems get worse. Get help right away if:  You have serious thoughts about hurting yourself or someone else. If you ever feel like you may hurt yourself or others, or have thoughts about taking your own life, get help right away. You can go to your nearest emergency department or call:  Your local emergency services (911 in the [place]  A suicide crisis helpline, such as the National Suicide Prevention Lifeline at 3208637215. This is open 24 hours a day. Summary  Insomnia is a sleep disorder that makes it difficult to fall asleep or stay asleep.  Insomnia can be long-term (chronic) or short-term (acute).  Treatment for insomnia depends on the cause. Treatment may focus on treating an underlying condition that is causing insomnia.  Keep a sleep diary to help you and your health care provider figure out what could be causing your insomnia. This information is not intended to replace advice given to you by your health care provider. Make sure you discuss any questions you have with your health care provider. Document Released: 03/10/2000 Document Revised: 02/23/2017 Document Reviewed: 12/21/2016 Elsevier Patient Education  2020 [employer].        I discussed the assessment and treatment plan with the patient. The patient was provided an opportunity to ask questions and all were answered. The patient agreed with the plan and demonstrated an understanding of the instructions.   The patient was advised to call back or seek an in-person  evaluation if  the symptoms worsen or if the condition fails to improve as anticipated.  I provided 34 minutes of non-face-to-face time during this encounter.  Signed,   Meredith StaggersJeffrey Alane Hanssen, MD [facility].  09/26/18

## 2018-09-26 NOTE — Patient Instructions (Signed)
Keep a record of your blood pressures outside of the office and call me or send me an email about those readings. Continue amlodipine same dose for now.   No change in gout meds for now.   tdap vaccine and HIV testing can be performed.   Cut back on caffeine - especially after lunch. No caffeine at dinner.  Decrease alcohol to no more than 2 drinks per day.  Restart melatonin each night.  If any pauses in breathing with snoring, let me know as you may need a sleep study.  See other insomnia information.  Follow up in 1 month in office and can recheck blood pressure and talk about sleep further.   Return to the clinic or go to the nearest emergency room if any of your symptoms worsen or new symptoms occur.    Insomnia Insomnia is a sleep disorder that makes it difficult to fall asleep or stay asleep. Insomnia can cause fatigue, low energy, difficulty concentrating, mood swings, and poor performance at work or school. There are three different ways to classify insomnia:  Difficulty falling asleep.  Difficulty staying asleep.  Waking up too early in the morning. Any type of insomnia can be long-term (chronic) or short-term (acute). Both are common. Short-term insomnia usually lasts for three months or less. Chronic insomnia occurs at least three times a week for longer than three months. What are the causes? Insomnia may be caused by another condition, situation, or substance, such as:  Anxiety.  Certain medicines.  Gastroesophageal reflux disease (GERD) or other gastrointestinal conditions.  Asthma or other breathing conditions.  Restless legs syndrome, sleep apnea, or other sleep disorders.  Chronic pain.  Menopause.  Stroke.  Abuse of alcohol, tobacco, or illegal drugs.  Mental health conditions, such as depression.  Caffeine.  Neurological disorders, such as Alzheimer's disease.  An overactive thyroid (hyperthyroidism). Sometimes, the cause of insomnia may not  be known. What increases the risk? Risk factors for insomnia include:  Gender. Women are affected more often than men.  Age. Insomnia is more common as you get older.  Stress.  Lack of exercise.  Irregular work schedule or working night shifts.  Traveling between different time zones.  Certain medical and mental health conditions. What are the signs or symptoms? If you have insomnia, the main symptom is having trouble falling asleep or having trouble staying asleep. This may lead to other symptoms, such as:  Feeling fatigued or having low energy.  Feeling nervous about going to sleep.  Not feeling rested in the morning.  Having trouble concentrating.  Feeling irritable, anxious, or depressed. How is this diagnosed? This condition may be diagnosed based on:  Your symptoms and medical history. Your health care provider may ask about: ? Your sleep habits. ? Any medical conditions you have. ? Your mental health.  A physical exam. How is this treated? Treatment for insomnia depends on the cause. Treatment may focus on treating an underlying condition that is causing insomnia. Treatment may also include:  Medicines to help you sleep.  Counseling or therapy.  Lifestyle adjustments to help you sleep better. Follow these instructions at home: Eating and drinking   Limit or avoid alcohol, caffeinated beverages, and cigarettes, especially close to bedtime. These can disrupt your sleep.  Do not eat a large meal or eat spicy foods right before bedtime. This can lead to digestive discomfort that can make it hard for you to sleep. Sleep habits   Keep a sleep diary to help  you and your health care provider figure out what could be causing your insomnia. Write down: ? When you sleep. ? When you wake up during the night. ? How well you sleep. ? How rested you feel the next day. ? Any side effects of medicines you are taking. ? What you eat and drink.  Make your bedroom a  dark, comfortable place where it is easy to fall asleep. ? Put up shades or blackout curtains to block light from outside. ? Use a white noise machine to block noise. ? Keep the temperature cool.  Limit screen use before bedtime. This includes: ? Watching TV. ? Using your smartphone, tablet, or computer.  Stick to a routine that includes going to bed and waking up at the same times every day and night. This can help you fall asleep faster. Consider making a quiet activity, such as reading, part of your nighttime routine.  Try to avoid taking naps during the day so that you sleep better at night.  Get out of bed if you are still awake after 15 minutes of trying to sleep. Keep the lights down, but try reading or doing a quiet activity. When you feel sleepy, go back to bed. General instructions  Take over-the-counter and prescription medicines only as told by your health care provider.  Exercise regularly, as told by your health care provider. Avoid exercise starting several hours before bedtime.  Use relaxation techniques to manage stress. Ask your health care provider to suggest some techniques that may work well for you. These may include: ? Breathing exercises. ? Routines to release muscle tension. ? Visualizing peaceful scenes.  Make sure that you drive carefully. Avoid driving if you feel very sleepy.  Keep all follow-up visits as told by your health care provider. This is important. Contact a health care provider if:  You are tired throughout the day.  You have trouble in your daily routine due to sleepiness.  You continue to have sleep problems, or your sleep problems get worse. Get help right away if:  You have serious thoughts about hurting yourself or someone else. If you ever feel like you may hurt yourself or others, or have thoughts about taking your own life, get help right away. You can go to your nearest emergency department or call:  Your local emergency  services (911 in the [place]  A suicide crisis helpline, such as the National Suicide Prevention Lifeline at 717-116-29451-775-096-2286. This is open 24 hours a day. Summary  Insomnia is a sleep disorder that makes it difficult to fall asleep or stay asleep.  Insomnia can be long-term (chronic) or short-term (acute).  Treatment for insomnia depends on the cause. Treatment may focus on treating an underlying condition that is causing insomnia.  Keep a sleep diary to help you and your health care provider figure out what could be causing your insomnia. This information is not intended to replace advice given to you by your health care provider. Make sure you discuss any questions you have with your health care provider. Document Released: 03/10/2000 Document Revised: 02/23/2017 Document Reviewed: 12/21/2016 Elsevier Patient Education  2020 [employer]

## 2018-09-30 ENCOUNTER — Ambulatory Visit: Payer: BC Managed Care – PPO | Admitting: Family Medicine

## 2018-09-30 ENCOUNTER — Encounter: Payer: Self-pay | Admitting: Family Medicine

## 2018-09-30 ENCOUNTER — Other Ambulatory Visit: Payer: Self-pay

## 2018-09-30 VITALS — BP 130/82 | HR 95 | Temp 98.5°F | Resp 18 | Ht 71.0 in | Wt 219.0 lb

## 2018-09-30 DIAGNOSIS — M79621 Pain in right upper arm: Secondary | ICD-10-CM | POA: Diagnosis not present

## 2018-09-30 DIAGNOSIS — M25562 Pain in left knee: Secondary | ICD-10-CM | POA: Diagnosis not present

## 2018-09-30 DIAGNOSIS — M25421 Effusion, right elbow: Secondary | ICD-10-CM

## 2018-09-30 MED ORDER — MELOXICAM 7.5 MG PO TABS: 7.5000 mg | ORAL_TABLET | Freq: Every day | ORAL | 0 refills | Status: DC | PRN

## 2018-09-30 MED ORDER — TRAMADOL HCL 50 MG PO TABS: 50.0000 mg | ORAL_TABLET | Freq: Three times a day (TID) | ORAL | 0 refills | Status: AC | PRN

## 2018-09-30 NOTE — Progress Notes (Signed)
Subjective:    Patient ID: Dustin Logan, male    DOB: 10-Feb-1966, 53 y.o.   MRN: [id number]  HPI Dustin Logan is a 53 y.o. male Presents today for: Chief Complaint  Patient presents with   Knee Pain    pt states this past weekend he was playing with his Grandkids and heard a pop in the left knee. He states it is swollen and painful    L knee pain: 2 days ago.  Playing in swimming pool this weekend with grandkids. Standing up, moving backwards, felt pop in L knee pain. Able to weight bear, but limping.  Swelling later that night and into next morning.  No prior surgery, no prior injection.  Hx of gout in past, last flare 1 year ago.   Tx: ibuprofen - 800mg  at 11am. ice few times per day, elevation. Ibuprofen 2 times yesterday.   Alcohol up to 4 beers times per week not daily withdrawal.  No hx of PUD.   Other injury 1 month ago.  Fell onto R elbow.  Able to move elbow ok, but few days later - noticed swelling on outside of upper arm. Hurt to lift arm in certain ways.  Noticed swelling the other night.  Tx: ice  Lab Results  Component Value Date   CREATININE 0.95 06/27/2018   Has crutches at home if needed, able to walk without at this time.        Patient Active Problem List   Diagnosis Date Noted   Essential hypertension, benign 03/29/2018   Tourette's syndrome 06/01/2015   Gout 10/03/2013   Past Medical History:  Diagnosis Date   Gout attack    Past Surgical History:  Procedure Laterality Date   HERNIA REPAIR     No Known Allergies Prior to Admission medications   Medication Sig Start Date End Date Taking? Authorizing Provider  allopurinol (ZYLOPRIM) 100 MG tablet Take 2 tablets (200 mg total) by mouth daily. 09/26/18  Yes [name], MD  amLODipine (NORVASC) 5 MG tablet Take 1 tablet (5 mg total) by mouth daily. 09/26/18  Yes [name], MD  cyclobenzaprine (FLEXERIL) 10 MG tablet Take 1 tablet (10 mg total) by mouth at bedtime.  Needs office visit 01/29/18  Yes Delia Chimes A, MD  indomethacin (INDOCIN) 50 MG capsule TAKE 1 CAPSULE BY MOUTH TWICE DAILY WITH A MEAL AS NEEDED FOR GOUT 03/29/18  Yes [name], MD   Social History   Socioeconomic History   Marital status: Married    Spouse name: Not on file   Number of children: Not on file   Years of education: Not on file   Highest education level: Not on file  Occupational History   Not on file  Social Needs   Financial resource strain: Not on file   Food insecurity    Worry: Not on file    Inability: Not on file   Transportation needs    Medical: Not on file    Non-medical: Not on file  Tobacco Use   Smoking status: Current Some Day Smoker    Types: Cigars   Smokeless tobacco: Never Used   Tobacco comment: smokes 10 cigars in a week  Substance and Sexual Activity   Alcohol use: Yes    Alcohol/week: 12.0 standard drinks    Types: 12 Standard drinks or equivalent per week   Drug use: No   Sexual activity: Not on file  Lifestyle   Physical activity  Days per week: Not on file    Minutes per session: Not on file   Stress: Not on file  Relationships   Social connections    Talks on phone: Not on file    Gets together: Not on file    Attends religious service: Not on file    Active member of club or organization: Not on file    Attends meetings of clubs or organizations: Not on file    Relationship status: Not on file   Intimate partner violence    Fear of current or ex partner: Not on file    Emotionally abused: Not on file    Physically abused: Not on file    Forced sexual activity: Not on file  Other Topics Concern   Not on file  Social History Narrative   Not on file    Review of Systems     Objective:   Physical Exam Vitals signs and nursing note reviewed.  Musculoskeletal:     Right shoulder: He exhibits decreased range of motion and swelling (small area of sts/incongruity at distal deltoid, no pain  with testing. ). He exhibits no tenderness and no bony tenderness.     Right elbow: Normal.He exhibits normal range of motion (pain free bicep and tricep testing. ) and no swelling. No tenderness found.     Left knee: He exhibits swelling, effusion and abnormal meniscus (pain with mcmurray - medial jt line. ). He exhibits no ecchymosis, no laceration, no erythema, no LCL laxity, normal patellar mobility and no MCL laxity. Decreased range of motion: 90 degrees flexion, lacks 5-10 extension.  Tenderness found. Medial joint line tenderness noted.    Vitals:   09/30/18 1653  BP: 130/82  Pulse: 95  Resp: 18  Temp: 98.5 F (36.9 C)  TempSrc: Oral  SpO2: 95%  Weight: 219 lb (99.3 kg)  Height:  (1.803 m)    Dg Knee Complete 4 Views Left  Result Date: 10/01/2018 CLINICAL DATA:  53 year old male status post twisting injury 2 days ago. Pain and swelling. EXAM: LEFT KNEE - COMPLETE 4+ VIEW COMPARISON:  None. FINDINGS: Small to moderate suprapatellar joint effusion. Patella intact. Alignment at the left knee is preserved. Mild medial compartment joint space loss. Mild patellofemoral and medial compartment degenerative spurring. No acute osseous abnormality identified. IMPRESSION: Positive for joint effusion. No acute osseous abnormality identified. Electronically Signed   By: Odessa Fleming M.D.   On: 10/01/2018 10:10   Dg Humerus Right  Result Date: 10/01/2018 CLINICAL DATA:  52 year old male status post fall 1 month ago with continued pain and swelling in the upper arm. EXAM: RIGHT HUMERUS - 2+ VIEW COMPARISON:  Right elbow series 12/02/2010. FINDINGS: Bone mineralization is within normal limits. There is no evidence of fracture or other focal bone lesions. Alignment at the right shoulder and elbow appears preserved. No discrete soft tissue injury. Negative visible right ribs. IMPRESSION: Negative. Electronically Signed   By: Odessa Fleming M.D.   On: 10/01/2018 10:12         Assessment & Plan:    Dustin Logan is a 53 y.o. male Acute pain of left knee - Plan: traMADol (ULTRAM) 50 MG tablet, DG Knee Complete 4 Views Left, Apply knee sleeve, meloxicam (MOBIC) 7.5 MG tablet  -Based on injury pattern and exam suspicious for degenerative meniscal tear.  No signs of fracture on x-ray.  -Hinged knee brace provided, symptomatic care discussed, meloxicam 1 to 2/day as needed for pain with  potential side effects and risk discussed, short-term use discussed.  Tramadol if needed for more severe pain.  Risks discussed.   -Recheck in the next 1 week, possible Ortho referral at that time  Pain in right upper arm - Plan: meloxicam (MOBIC) 7.5 MG tablet Swelling of joint of upper arm, right - Plan: DG Humerus Right, meloxicam (MOBIC) 7.5 MG tablet  -Subacute injury.  Overall functionally has done well except for certain lifting motions with internal rotation and abducted shoulder.  Based on location possible small avulsion/tear of deltoid.  X-ray without concerning findings.  Likely will refer to Ortho, discuss at follow-up visit in 1 week  Meds ordered this encounter  Medications   traMADol (ULTRAM) 50 MG tablet    Sig: Take 1 tablet (50 mg total) by mouth every 8 (eight) hours as needed for up to 5 days.    Dispense:  15 tablet    Refill:  0   meloxicam (MOBIC) 7.5 MG tablet    Sig: Take 1-2 tablets (7.5-15 mg total) by mouth daily as needed for pain.    Dispense:  30 tablet    Refill:  0   Patient Instructions   [facility] Address: [address], [place], [place] [address] Hours:  Phone: [phone]  Walk-in from 9-1 for xray's    Meloxicam 1 to 2/day as needed for knee pain and swelling, tramadol if needed for more severe pain, especially at bedtime.  Do not take over-the-counter pain relievers other than Tylenol if needed. Wear knee sleeve to help provide protection to the left knee.  Crutches if needed.  Ice and elevation as discussed and see information below.  Knee x-ray and  upper arm x-ray can be performed in the next few days [facility].  Follow-up with me in 1 week.  Sooner if worse Depending on x-rays, I will likely refer you to orthopedics to evaluate the upper arm issue and possibly knee issue as well, we can decide this at follow-up next week   Return to the clinic or go to the nearest emergency room if any of your symptoms worsen or new symptoms occur.   RICE Therapy for Routine Care of Injuries Many injuries can be cared for with rest, ice, compression, and elevation (RICE therapy). This includes:  Resting the injured part.  Putting ice on the injury.  Putting pressure (compression) on the injury.  Raising the injured part (elevation). Using RICE therapy can help to lessen pain and swelling. Supplies needed:  Ice.  Plastic bag.  Towel.  Elastic bandage.  Pillow or pillows to raise (elevate) your injured body part. How to care for your injury with RICE therapy Rest Limit your normal activities, and try not to use the injured part of your body. You can go back to your normal activities when your doctor says it is okay to do them and you feel okay. Ask your doctor if you should do exercises to help your injury get better. Ice Put ice on the injured area. Do not put ice on your bare skin.  Put ice in a plastic bag.  Place a towel between your skin and the bag.  Leave the ice on for 20 minutes, 2-3 times a day. Use ice on as many days as told by your doctor.  Compression Compression means putting pressure on the injured area. This can be done with an elastic bandage. If an elastic bandage has been put on your injury:  Do not wrap the bandage  too tight. Wrap the bandage more loosely if part of your body away from the bandage is blue, swollen, cold, painful, or loses feeling (gets numb).  Take off the bandage and put it on again. Do this every 3-4 hours or as told by your doctor.  See your doctor if the bandage seems to make your  problems worse.  Elevation Elevation means keeping the injured area raised. If you can, raise the injured area above your heart or the center of your chest. Contact a doctor if:  You keep having pain and swelling.  Your symptoms get worse. Get help right away if:  You have sudden bad pain at your injury or lower than your injury.  You have redness or more swelling around your injury.  You have tingling or numbness at your injury or lower than your injury, and it does not go away when you take off the bandage. Summary  Many injuries can be cared for using rest, ice, compression, and elevation (RICE therapy).  You can go back to your normal activities when you feel okay and your doctor says it is okay.  Put ice on the injured area as told by your doctor.  Get help if your symptoms get worse or if you keep having pain and swelling. This information is not intended to replace advice given to you by your health care provider. Make sure you discuss any questions you have with your health care provider. Document Released: 08/30/2007 Document Revised: 12/01/2016 Document Reviewed: 12/01/2016 Elsevier Patient Education  2020 Elsevier Inc.  Acute Knee Pain, Adult Many things can cause knee pain. Sometimes, knee pain is sudden (acute) and may be caused by damage, swelling, or irritation of the muscles and tissues that support your knee. The pain often goes away on its own with time and rest. If the pain does not go away, tests may be done to find out what is causing the pain. Follow these instructions at home: Pay attention to any changes in your symptoms. Take these actions to relieve your pain. If you have a knee sleeve or brace:   Wear the sleeve or brace as told by your doctor. Remove it only as told by your doctor.  Loosen the sleeve or brace if your toes: ? Tingle. ? Become numb. ? Turn cold and blue.  Keep the sleeve or brace clean.  If the sleeve or brace is not  waterproof: ? Do not let it get wet. ? Cover it with a watertight covering when you take a bath or shower. Activity  Rest your knee.  Do not do things that cause pain.  Avoid activities where both feet leave the ground at the same time (high-impact activities). Examples are running, jumping rope, and doing jumping jacks.  Work with a physical therapist to make a safe exercise program, as told by your doctor. Managing pain, stiffness, and swelling   If told, put ice on the knee: ? Put ice in a plastic bag. ? Place a towel between your skin and the bag. ? Leave the ice on for 20 minutes, 2-3 times a day.  If told, put pressure (compression) on your injured knee to control swelling, give support, and help with discomfort. Compression may be done with an elastic bandage. General instructions  Take all medicines only as told by your doctor.  Raise (elevate) your knee while you are sitting or lying down. Make sure your knee is higher than your heart.  Sleep with a pillow under  your knee.  Do not use any products that contain nicotine or tobacco. These include cigarettes, e-cigarettes, and chewing tobacco. These products may slow down healing. If you need help quitting, ask your doctor.  If you are overweight, work with your doctor and a food expert (dietitian) to set goals to lose weight. Being overweight can make your knee hurt more.  Keep all follow-up visits as told by your doctor. This is important. Contact a doctor if:  The knee pain does not stop.  The knee pain changes or gets worse.  You have a fever along with knee pain.  Your knee feels warm when you touch it.  Your knee gives out or locks up. Get help right away if:  Your knee swells, and the swelling gets worse.  You cannot move your knee.  You have very bad knee pain. Summary  Many things can cause knee pain. The pain often goes away on its own with time and rest.  Your doctor may do tests to find out  the cause of the pain.  Pay attention to any changes in your symptoms. Relieve your pain with rest, medicines, light activity, and use of ice.  Get help right away if you cannot move your knee or your knee pain is very bad. This information is not intended to replace advice given to you by your health care provider. Make sure you discuss any questions you have with your health care provider. Document Released: 06/09/2008 Document Revised: 08/23/2017 Document Reviewed: 08/23/2017 Elsevier Patient Education  [employer]2020 Else[employer] Inc.   If you have lab work done today you will be contacted with your lab results within the next 2 weeks.  If you have not heard from us then please contact us. The fastest way to get your results is to register for My Chart.   IF you received an x-ray today, you will receive an invoice from [facility]. Please contact [facility] at [phone] with questions or concerns regarding your invoice.   IF you received labwork today, you will receive an invoice from [facility]. Please contact [facility] at [phone] with questions or concerns regarding your invoice.   Our billing staff will not be able to assist you with questions regarding bills from these companies.  You will be contacted with the lab results as soon as they are available. The fastest way to get your results is to activate your My Chart account. Instructions are located on the last page of this paperwork. If you have not heard from us regarding the results in 2 weeks, please contact this office.       Signed,   Meredith StaggersJeffrey Traci Plemons, MD [facility] at [facility].  10/03/18 9:57 PM

## 2018-09-30 NOTE — Patient Instructions (Addendum)
[facility] Address: [address], [place], [place] [address] Hours:  Phone: [phone]  Walk-in from 9-1 for xray's    Meloxicam 1 to 2/day as needed for knee pain and swelling, tramadol if needed for more severe pain, especially at bedtime.  Do not take over-the-counter pain relievers other than Tylenol if needed. Wear knee sleeve to help provide protection to the left knee.  Crutches if needed.  Ice and elevation as discussed and see information below.  Knee x-ray and upper arm x-ray can be performed in the next few days [facility].  Follow-up with me in 1 week.  Sooner if worse Depending on x-rays, I will likely refer you to orthopedics to evaluate the upper arm issue and possibly knee issue as well, we can decide this at follow-up next week   Return to the clinic or go to the nearest emergency room if any of your symptoms worsen or new symptoms occur.   RICE Therapy for Routine Care of Injuries Many injuries can be cared for with rest, ice, compression, and elevation (RICE therapy). This includes:  Resting the injured part.  Putting ice on the injury.  Putting pressure (compression) on the injury.  Raising the injured part (elevation). Using RICE therapy can help to lessen pain and swelling. Supplies needed:  Ice.  Plastic bag.  Towel.  Elastic bandage.  Pillow or pillows to raise (elevate) your injured body part. How to care for your injury with RICE therapy Rest Limit your normal activities, and try not to use the injured part of your body. You can go back to your normal activities when your doctor says it is okay to do them and you feel okay. Ask your doctor if you should do exercises to help your injury get better. Ice Put ice on the injured area. Do not put ice on your bare skin.  Put ice in a plastic bag.  Place a towel between your skin and the bag.  Leave the ice on for 20 minutes, 2-3 times a day. Use ice on as many days as told by your  doctor.  Compression Compression means putting pressure on the injured area. This can be done with an elastic bandage. If an elastic bandage has been put on your injury:  Do not wrap the bandage too tight. Wrap the bandage more loosely if part of your body away from the bandage is blue, swollen, cold, painful, or loses feeling (gets numb).  Take off the bandage and put it on again. Do this every 3-4 hours or as told by your doctor.  See your doctor if the bandage seems to make your problems worse.  Elevation Elevation means keeping the injured area raised. If you can, raise the injured area above your heart or the center of your chest. Contact a doctor if:  You keep having pain and swelling.  Your symptoms get worse. Get help right away if:  You have sudden bad pain at your injury or lower than your injury.  You have redness or more swelling around your injury.  You have tingling or numbness at your injury or lower than your injury, and it does not go away when you take off the bandage. Summary  Many injuries can be cared for using rest, ice, compression, and elevation (RICE therapy).  You can go back to your normal activities when you feel okay and your doctor says it is okay.  Put ice on the injured area as told by your doctor.  Get help if your symptoms get worse or if you keep having pain and swelling. This information is not intended to replace advice given to you by your health care provider. Make sure you discuss any questions you have with your health care provider. Document Released: 08/30/2007 Document Revised: 12/01/2016 Document Reviewed: 12/01/2016 Elsevier Patient Education  2020 Elsevier Inc.  Acute Knee Pain, Adult Many things can cause knee pain. Sometimes, knee pain is sudden (acute) and may be caused by damage, swelling, or irritation of the muscles and tissues that support your knee. The pain often goes away on its own with time and rest. If the pain does  not go away, tests may be done to find out what is causing the pain. Follow these instructions at home: Pay attention to any changes in your symptoms. Take these actions to relieve your pain. If you have a knee sleeve or brace:   Wear the sleeve or brace as told by your doctor. Remove it only as told by your doctor.  Loosen the sleeve or brace if your toes: ? Tingle. ? Become numb. ? Turn cold and blue.  Keep the sleeve or brace clean.  If the sleeve or brace is not waterproof: ? Do not let it get wet. ? Cover it with a watertight covering when you take a bath or shower. Activity  Rest your knee.  Do not do things that cause pain.  Avoid activities where both feet leave the ground at the same time (high-impact activities). Examples are running, jumping rope, and doing jumping jacks.  Work with a physical therapist to make a safe exercise program, as told by your doctor. Managing pain, stiffness, and swelling   If told, put ice on the knee: ? Put ice in a plastic bag. ? Place a towel between your skin and the bag. ? Leave the ice on for 20 minutes, 2-3 times a day.  If told, put pressure (compression) on your injured knee to control swelling, give support, and help with discomfort. Compression may be done with an elastic bandage. General instructions  Take all medicines only as told by your doctor.  Raise (elevate) your knee while you are sitting or lying down. Make sure your knee is higher than your heart.  Sleep with a pillow under your knee.  Do not use any products that contain nicotine or tobacco. These include cigarettes, e-cigarettes, and chewing tobacco. These products may slow down healing. If you need help quitting, ask your doctor.  If you are overweight, work with your doctor and a food expert (dietitian) to set goals to lose weight. Being overweight can make your knee hurt more.  Keep all follow-up visits as told by your doctor. This is important. Contact  a doctor if:  The knee pain does not stop.  The knee pain changes or gets worse.  You have a fever along with knee pain.  Your knee feels warm when you touch it.  Your knee gives out or locks up. Get help right away if:  Your knee swells, and the swelling gets worse.  You cannot move your knee.  You have very bad knee pain. Summary  Many things can cause knee pain. The pain often goes away on its own with time and rest.  Your doctor may do tests to find out the cause of the pain.  Pay attention to any changes in your symptoms. Relieve your pain with rest, medicines, light activity, and use of ice.  Get help  right away if you cannot move your knee or your knee pain is very bad. This information is not intended to replace advice given to you by your health care provider. Make sure you discuss any questions you have with your health care provider. Document Released: 06/09/2008 Document Revised: 08/23/2017 Document Reviewed: 08/23/2017 Elsevier Patient Education  [employer]2020 Else[employer] Inc.   If you have lab work done today you will be contacted with your lab results within the next 2 weeks.  If you have not heard from us then please contact us. The fastest way to get your results is to register for My Chart.   IF you received an x-ray today, you will receive an invoice from [facility]. Please contact [facility] at [phone] with questions or concerns regarding your invoice.   IF you received labwork today, you will receive an invoice from [facility]. Please contact [facility] at [phone] with questions or concerns regarding your invoice.   Our billing staff will not be able to assist you with questions regarding bills from these companies.  You will be contacted with the lab results as soon as they are available. The fastest way to get your results is to activate your My Chart account. Instructions are located on the last page of this paperwork. If you have not  heard from us regarding the results in 2 weeks, please contact this office.

## 2018-10-01 ENCOUNTER — Ambulatory Visit
Admission: RE | Admit: 2018-10-01 | Discharge: 2018-10-01 | Disposition: A | Discharge status: Home / Self Care | Payer: BC Managed Care – PPO | Source: Ambulatory Visit | Attending: Family Medicine | Admitting: Family Medicine

## 2018-10-01 DIAGNOSIS — M79621 Pain in right upper arm: Secondary | ICD-10-CM | POA: Diagnosis not present

## 2018-10-01 DIAGNOSIS — M25562 Pain in left knee: Secondary | ICD-10-CM

## 2018-10-01 DIAGNOSIS — M25462 Effusion, left knee: Secondary | ICD-10-CM | POA: Diagnosis not present

## 2018-10-01 DIAGNOSIS — M7989 Other specified soft tissue disorders: Secondary | ICD-10-CM | POA: Diagnosis not present

## 2018-10-01 DIAGNOSIS — M25421 Effusion, right elbow: Secondary | ICD-10-CM

## 2018-10-23 ENCOUNTER — Encounter: Payer: Self-pay | Admitting: Family Medicine

## 2018-10-23 NOTE — Progress Notes (Signed)
Normal colon repeat in 10years

## 2018-11-04 ENCOUNTER — Encounter: Payer: Self-pay | Admitting: Family Medicine

## 2018-11-04 ENCOUNTER — Ambulatory Visit: Payer: BC Managed Care – PPO | Admitting: Family Medicine

## 2018-11-04 ENCOUNTER — Other Ambulatory Visit: Payer: Self-pay

## 2018-11-04 VITALS — BP 136/80 | HR 66 | Temp 98.6°F | Resp 14 | Wt 219.4 lb

## 2018-11-04 DIAGNOSIS — M25562 Pain in left knee: Secondary | ICD-10-CM

## 2018-11-04 DIAGNOSIS — M79621 Pain in right upper arm: Secondary | ICD-10-CM | POA: Diagnosis not present

## 2018-11-04 DIAGNOSIS — G4700 Insomnia, unspecified: Secondary | ICD-10-CM

## 2018-11-04 DIAGNOSIS — M25421 Effusion, right elbow: Secondary | ICD-10-CM | POA: Diagnosis not present

## 2018-11-04 DIAGNOSIS — I10 Essential (primary) hypertension: Secondary | ICD-10-CM | POA: Diagnosis not present

## 2018-11-04 NOTE — Patient Instructions (Addendum)
Ok to use mobic on occasion and knee brace, but if knee starts to worsen I would discuss with ortho.  Same with left upper arm - may have had deltoid muscle injury and would recommend meeting with orthopaedics. Let me know if I can send a referral. If any worsening of symptoms would definitely recommend you see them. Call me and I can send a referral.   Blood pressure ok today. No med changes, but follow up in 3 months for repeat labs.   Return to the clinic or go to the nearest emergency room if any of your symptoms worsen or new symptoms occur.  Thanks for coming in today.     If you have lab work done today you will be contacted with your lab results within the next 2 weeks.  If you have not heard from [place] then please contact us. The fastest way to get your results is to register for My Chart.   IF you received an x-ray today, you will receive an invoice from [facility]. Please contact [facility] at [phone] with questions or concerns regarding your invoice.   IF you received labwork today, you will receive an invoice from [place]. Please contact [facility] at [phone] with questions or concerns regarding your invoice.   Our billing staff will not be able to assist you with questions regarding bills from these companies.  You will be contacted with the lab results as soon as they are available. The fastest way to get your results is to activate your My Chart account. Instructions are located on the last page of this paperwork. If you have not heard from [place] regarding the results in 2 weeks, please contact this office.    \

## 2018-11-04 NOTE — Progress Notes (Signed)
Subjective:    Patient ID: Dustin Logan, male    DOB: 1965-09-12, 53 y.o.   MRN: [id number]  HPI Dustin PedSonny L Logan is a 53 y.o. male Presents today for: Chief Complaint  Patient presents with  . Hypertension    Have not been taking bp at home  . Insomnia    doing much better  . Knee Pain    never heard back from the Dr about xray of the left knee, and right arm Follow up   Hypertension: BP Readings from Last 3 Encounters:  11/04/18 136/80  09/30/18 130/82  03/29/18 (!) 140/98   Lab Results  Component Value Date   CREATININE 0.95 06/27/2018  norvasc 5mg  qd.    L knee pain See 7/6 eval.  Based on injury pattern and exam was suspicious for degenerative meniscal tear at that time.  Placed in a hinged knee brace, meloxicam 7.550 mg daily as needed and tramadol if needed for more severe pain.  Initial plan was to follow-up in 1 week with possible Ortho eval if not improving. Still some soreness if sitting or certain activity.  Has been using brace initially, not needed recently.  Only using mobic as needed. No mechanical symptoms.   R upper arm pain.  Occasional pain, see last ov. Not limiting arm use. See last ov - possible subacute injury of deltoid. Moving ok now.   Insomnia: See July 2 visit.  Decrease caffeine use, other sleep hygiene discussed, melatonin recommended to be restarted, and alcohol use discussed.  Decreased caffeine in afternoon.  Occasional melatonin. Improved sleep.     Patient Active Problem List   Diagnosis Date Noted  . Essential hypertension, benign 03/29/2018  . Tourette's syndrome 06/01/2015  . Gout 10/03/2013   Past Medical History:  Diagnosis Date  . Gout attack    Past Surgical History:  Procedure Laterality Date  . HERNIA REPAIR     No Known Allergies Prior to Admission medications   Medication Sig Start Date End Date Taking? Authorizing Provider  allopurinol (ZYLOPRIM) 100 MG tablet Take 2 tablets (200 mg total) by mouth  daily. 09/26/18  Yes Shade FloodGreene, Kyilee Gregg R, MD  amLODipine (NORVASC) 5 MG tablet Take 1 tablet (5 mg total) by mouth daily. 09/26/18  Yes Shade FloodGreene, Georgi Navarrete R, MD  cyclobenzaprine (FLEXERIL) 10 MG tablet Take 1 tablet (10 mg total) by mouth at bedtime. Needs office visit 01/29/18  Yes Collie SiadStallings, Zoe A, MD  indomethacin (INDOCIN) 50 MG capsule TAKE 1 CAPSULE BY MOUTH TWICE DAILY WITH A MEAL AS NEEDED FOR GOUT 03/29/18  Yes Myles LippsSantiago, Irma M, MD  meloxicam (MOBIC) 7.5 MG tablet Take 1-2 tablets (7.5-15 mg total) by mouth daily as needed for pain. 09/30/18  Yes Shade FloodGreene, Shemaiah Round R, MD   Social History   Socioeconomic History  . Marital status: Married    Spouse name: Not on file  . Number of children: Not on file  . Years of education: Not on file  . Highest education level: Not on file  Occupational History  . Not on file  Social Needs  . Financial resource strain: Not on file  . Food insecurity    Worry: Not on file    Inability: Not on file  . Transportation needs    Medical: Not on file    Non-medical: Not on file  Tobacco Use  . Smoking status: Current Some Day Smoker    Types: Cigars  . Smokeless tobacco: Never Used  . Tobacco comment: smokes  10 cigars in a week  Substance and Sexual Activity  . Alcohol use: Yes    Alcohol/week: 12.0 standard drinks    Types: 12 Standard drinks or equivalent per week  . Drug use: No  . Sexual activity: Not on file  Lifestyle  . Physical activity    Days per week: Not on file    Minutes per session: Not on file  . Stress: Not on file  Relationships  . Social Musicianconnections    Talks on phone: Not on file    Gets together: Not on file    Attends religious service: Not on file    Active member of club or organization: Not on file    Attends meetings of clubs or organizations: Not on file    Relationship status: Not on file  . Intimate partner violence    Fear of current or ex partner: Not on file    Emotionally abused: Not on file    Physically abused:  Not on file    Forced sexual activity: Not on file  Other Topics Concern  . Not on file  Social History Narrative  . Not on file    Review of Systems  Constitutional: Negative for fatigue and unexpected weight change.  Eyes: Negative for visual disturbance.  Respiratory: Negative for cough, chest tightness and shortness of breath.   Cardiovascular: Negative for chest pain, palpitations and leg swelling.  Gastrointestinal: Negative for abdominal pain and blood in stool.  Neurological: Negative for dizziness, light-headedness and headaches.       Objective:   Physical Exam Vitals signs reviewed.  Constitutional:      Appearance: He is well-developed.  HENT:     Head: Normocephalic and atraumatic.  Eyes:     Pupils: Pupils are equal, round, and reactive to light.  Neck:     Vascular: No carotid bruit or JVD.  Cardiovascular:     Rate and Rhythm: Normal rate and regular rhythm.     Heart sounds: Normal heart sounds. No murmur.  Pulmonary:     Effort: Pulmonary effort is normal.     Breath sounds: Normal breath sounds. No rales.  Musculoskeletal:     Left knee: He exhibits effusion (1+ effusion, from, nontender. negative drawer/mcmurray/varus/valgus testing. ). He exhibits normal range of motion, no ecchymosis, no deformity, no LCL laxity, no bony tenderness, normal meniscus and no MCL laxity. No tenderness found. No medial joint line tenderness noted.       Arms:  Skin:    General: Skin is warm and dry.  Neurological:     Mental Status: He is alert and oriented to person, place, and time.    Vitals:   11/04/18 0916 11/04/18 0920  BP: (!) 150/93 136/80  Pulse: 66   Resp: 14   Temp: 98.6 F (37 C)   TempSrc: Oral   SpO2: 100%   Weight: 219 lb 6.4 oz (99.5 kg)           Assessment & Plan:  Dustin Logan is a 53 y.o. male Acute pain of left knee  -Still possible degenerative meniscal tear, but no mechanical symptoms, improved symptoms.  Still some effusion.   Offered orthopedic evaluation but declined at this time.  If any change in symptoms or he changes his mind can refer at that time.  Episodic meloxicam if needed.  Pain in right upper arm Swelling of joint of upper arm, right  -Possible deltoid strain/tear, subacute injury.  Strength overall intact, nontender,  full range of motion.  Declined orthopedic eval at this time but option given.  Essential hypertension  -Stable on recheck, continue amlodipine same dose with repeat office visit 3 months for repeat testing including lipids.  Insomnia, unspecified type  -Improved with change on caffeine, melatonin discussed every night for improved control, previous handout given.  RTC precautions.  No orders of the defined types were placed in this encounter.  Patient Instructions   Ok to use mobic on occasion and knee brace, but if knee starts to worsen I would discuss with ortho.  Same with left upper arm - may have had deltoid muscle injury and would recommend meeting with orthopaedics. Let me know if I can send a referral. If any worsening of symptoms would definitely recommend you see them. Call me and I can send a referral.   Blood pressure ok today. No med changes, but follow up in 3 months for repeat labs.   Return to the clinic or go to the nearest emergency room if any of your symptoms worsen or new symptoms occur.  Thanks for coming in today.     If you have lab work done today you will be contacted with your lab results within the next 2 weeks.  If you have not heard from [place] then please contact us. The fastest way to get your results is to register for My Chart.   IF you received an x-ray today, you will receive an invoice from [facility]. Please contact [facility] at [phone] with questions or concerns regarding your invoice.   IF you received labwork today, you will receive an invoice from [place]. Please contact [facility] at [phone] with questions or  concerns regarding your invoice.   Our billing staff will not be able to assist you with questions regarding bills from these companies.  You will be contacted with the lab results as soon as they are available. The fastest way to get your results is to activate your My Chart account. Instructions are located on the last page of this paperwork. If you have not heard from [place] regarding the results in 2 weeks, please contact this office.    \ Signed,   Merri Ray, MD [facility].  11/04/18 9:57 AM

## 2019-02-03 ENCOUNTER — Ambulatory Visit (INDEPENDENT_AMBULATORY_CARE_PROVIDER_SITE_OTHER): Payer: BC Managed Care – PPO | Admitting: Family Medicine

## 2019-02-03 ENCOUNTER — Encounter: Payer: Self-pay | Admitting: Family Medicine

## 2019-02-03 ENCOUNTER — Other Ambulatory Visit: Payer: Self-pay

## 2019-02-03 VITALS — BP 127/80 | HR 68 | Temp 98.5°F | Wt 221.4 lb

## 2019-02-03 DIAGNOSIS — E785 Hyperlipidemia, unspecified: Secondary | ICD-10-CM | POA: Diagnosis not present

## 2019-02-03 DIAGNOSIS — G4700 Insomnia, unspecified: Secondary | ICD-10-CM | POA: Diagnosis not present

## 2019-02-03 DIAGNOSIS — I10 Essential (primary) hypertension: Secondary | ICD-10-CM | POA: Diagnosis not present

## 2019-02-03 DIAGNOSIS — M1A09X0 Idiopathic chronic gout, multiple sites, without tophus (tophi): Secondary | ICD-10-CM | POA: Diagnosis not present

## 2019-02-03 MED ORDER — AMLODIPINE BESYLATE 5 MG PO TABS: 5.0000 mg | ORAL_TABLET | Freq: Every day | ORAL | 1 refills | Status: DC

## 2019-02-03 NOTE — Progress Notes (Signed)
Subjective:  Patient ID: Dustin Logan, male    DOB: 05-Sep-1965  Age: 53 y.o. MRN: [id number]  CC:  Chief Complaint  Patient presents with  . Medical Management of Chronic Issues    3 month f/u on bp and labs    HPI Dustin Logan presents for   Hypertension: Amlodipine 5 mg daily. Home readings - none.  No new side effects.  BP Readings from Last 3 Encounters:  02/03/19 127/80  11/04/18 136/80  09/30/18 130/82   Lab Results  Component Value Date   CREATININE 0.95 06/27/2018   Hyperlipidemia: No meds. Minor elevation prior.  Lab Results  Component Value Date   CHOL 220 (H) 06/27/2018   HDL 48 06/27/2018   LDLCALC 106 (H) 06/27/2018   TRIG 329 (H) 06/27/2018   CHOLHDL 4.6 06/27/2018   Lab Results  Component Value Date   ALT 39 06/27/2018   AST 36 06/27/2018   ALKPHOS 78 06/27/2018   BILITOT 0.4 06/27/2018  The 10-year ASCVD risk score Mikey Bussing DC Jr., et al., 2013) is: 11.7%   Values used to calculate the score:     Age: 72 years     Sex: Male     Is Non-Hispanic African American: No     Diabetic: No     Tobacco smoker: Yes     Systolic Blood Pressure: 093 mmHg     Is BP treated: Yes     HDL Cholesterol: 48 mg/dL     Total Cholesterol: 220 mg/dL  Sprite this am. No food today.    Gout: Last flare: No recent significant flare, infrequent symptoms - min L knee pain few weeks ago.  Daily meds: Allopurinol 200 mg daily Prn med: Indomethacin 50 mg every few months with minor symptoms only. Lab Results  Component Value Date   LABURIC 5.4 06/27/2018   Insomnia Thought to be multifactorial when discussed in July.  Discussed caffeine, alcohol use.  Recommended melatonin.  Cut back alcohol to weekends only when discussed.  Tried melatonin - every now and then.  Cut back on caffeine after lunch.  Sleep is better, along with new bed.  Alcohol: about the same. Case beer per week.    History Patient Active Problem List   Diagnosis Date Noted  . Essential  hypertension, benign 03/29/2018  . Tourette's syndrome 06/01/2015  . Gout 10/03/2013   Past Medical History:  Diagnosis Date  . Gout attack    Past Surgical History:  Procedure Laterality Date  . HERNIA REPAIR     No Known Allergies Prior to Admission medications   Medication Sig Start Date End Date Taking? Authorizing Provider  allopurinol (ZYLOPRIM) 100 MG tablet Take 2 tablets (200 mg total) by mouth daily. 09/26/18  Yes [name], MD  amLODipine (NORVASC) 5 MG tablet Take 1 tablet (5 mg total) by mouth daily. 09/26/18  Yes [name], MD  cyclobenzaprine (FLEXERIL) 10 MG tablet Take 1 tablet (10 mg total) by mouth at bedtime. Needs office visit 01/29/18  Yes Delia Chimes A, MD  indomethacin (INDOCIN) 50 MG capsule TAKE 1 CAPSULE BY MOUTH TWICE DAILY WITH A MEAL AS NEEDED FOR GOUT 03/29/18  Yes [name], MD  meloxicam (MOBIC) 7.5 MG tablet Take 1-2 tablets (7.5-15 mg total) by mouth daily as needed for pain. 09/30/18  Yes [name], MD   Social History   Socioeconomic History  . Marital status: Married    Spouse name: Not on file  .  Number of children: Not on file  . Years of education: Not on file  . Highest education level: Not on file  Occupational History  . Not on file  Social Needs  . Financial resource strain: Not on file  . Food insecurity    Worry: Not on file    Inability: Not on file  . Transportation needs    Medical: Not on file    Non-medical: Not on file  Tobacco Use  . Smoking status: Current Some Day Smoker    Types: Cigars  . Smokeless tobacco: Never Used  . Tobacco comment: smokes 10 cigars in a week  Substance and Sexual Activity  . Alcohol use: Yes    Alcohol/week: 12.0 standard drinks    Types: 12 Standard drinks or equivalent per week  . Drug use: No  . Sexual activity: Not on file  Lifestyle  . Physical activity    Days per week: Not on file    Minutes per session: Not on file  . Stress: Not on file   Relationships  . Social Musicianconnections    Talks on phone: Not on file    Gets together: Not on file    Attends religious service: Not on file    Active member of club or organization: Not on file    Attends meetings of clubs or organizations: Not on file    Relationship status: Not on file  . Intimate partner violence    Fear of current or ex partner: Not on file    Emotionally abused: Not on file    Physically abused: Not on file    Forced sexual activity: Not on file  Other Topics Concern  . Not on file  Social History Narrative  . Not on file    Review of Systems  Constitutional: Negative for fatigue and unexpected weight change.  Eyes: Negative for visual disturbance.  Respiratory: Negative for cough, chest tightness and shortness of breath.   Cardiovascular: Negative for chest pain, palpitations and leg swelling.  Gastrointestinal: Negative for abdominal pain and blood in stool.  Neurological: Negative for dizziness, light-headedness and headaches.     Objective:   Vitals:   02/03/19 0910  BP: 127/80  Pulse: 68  Temp: 98.5 F (36.9 C)  TempSrc: Oral  SpO2: 98%  Weight: 221 lb 6.4 oz (100.4 kg)     Physical Exam Vitals signs reviewed.  Constitutional:      Appearance: He is well-developed.  HENT:     Head: Normocephalic and atraumatic.  Eyes:     Pupils: Pupils are equal, round, and reactive to light.  Neck:     Vascular: No carotid bruit or JVD.  Cardiovascular:     Rate and Rhythm: Normal rate and regular rhythm.     Heart sounds: Normal heart sounds. No murmur.  Pulmonary:     Effort: Pulmonary effort is normal.     Breath sounds: Normal breath sounds. No rales.  Skin:    General: Skin is warm and dry.  Neurological:     Mental Status: He is alert and oriented to person, place, and time.        Assessment & Plan:  Dustin Logan is a 53 y.o. male . Essential hypertension - Plan: Comprehensive metabolic panel, amLODipine (NORVASC) 5 MG  tablet  -  Stable, tolerating current regimen. Medications refilled. Labs pending as above.   Idiopathic chronic gout of multiple sites without tophus  - stable. No changes for now, rtc  precautions.   Hyperlipidemia, unspecified hyperlipidemia type - Plan: Lipid panel, Comprehensive metabolic panel  -Elevated ASCVD risk based on prior labs.  Updated labs will be obtained, then can discuss need for statin.  Insomnia, unspecified type  -Improved, continue to cut back on alcohol, avoidance of caffeine in the afternoon, melatonin as needed.  Recheck 6 months  No orders of the defined types were placed in this encounter.  Patient Instructions   No change in medicines for now.  I will check your cholesterol level again and can then discuss whether or not medicine is recommended.  Continue to try to cut back to alcohol no more than 1 at the most 2 drinks on average per day.  Keep up the good work with water in the afternoon, less caffeine as I think that will continue to help sleep.  Follow-up in 6 months, let me know if there are questions sooner.      If you have lab work done today you will be contacted with your lab results within the next 2 weeks.  If you have not heard from [place] then please contact us. The fastest way to get your results is to register for My Chart.   IF you received an x-ray today, you will receive an invoice from [facility]. Please contact [facility] at [phone] with questions or concerns regarding your invoice.   IF you received labwork today, you will receive an invoice from [place]. Please contact [facility] at [phone] with questions or concerns regarding your invoice.   Our billing staff will not be able to assist you with questions regarding bills from these companies.  You will be contacted with the lab results as soon as they are available. The fastest way to get your results is to activate your My Chart account. Instructions are  located on the last page of this paperwork. If you have not heard from [place] regarding the results in 2 weeks, please contact this office.          Signed, Meredith Staggers, MD [facility]

## 2019-02-03 NOTE — Patient Instructions (Addendum)
No change in medicines for now.  I will check your cholesterol level again and can then discuss whether or not medicine is recommended.  Continue to try to cut back to alcohol no more than 1 at the most 2 drinks on average per day.  Keep up the good work with water in the afternoon, less caffeine as I think that will continue to help sleep.  Follow-up in 6 months, let me know if there are questions sooner.      If you have lab work done today you will be contacted with your lab results within the next 2 weeks.  If you have not heard from [place] then please contact us. The fastest way to get your results is to register for My Chart.   IF you received an x-ray today, you will receive an invoice from [facility]. Please contact [facility] at [phone] with questions or concerns regarding your invoice.   IF you received labwork today, you will receive an invoice from [place]. Please contact [facility] at [phone] with questions or concerns regarding your invoice.   Our billing staff will not be able to assist you with questions regarding bills from these companies.  You will be contacted with the lab results as soon as they are available. The fastest way to get your results is to activate your My Chart account. Instructions are located on the last page of this paperwork. If you have not heard from [place] regarding the results in 2 weeks, please contact this office.

## 2019-02-04 LAB — COMPREHENSIVE METABOLIC PANEL
ALT: 39 IU/L (ref 0–44)
AST: 33 IU/L (ref 0–40)
Albumin/Globulin Ratio: 1.7 (ref 1.2–2.2)
Albumin: 4.3 g/dL (ref 3.8–4.9)
Alkaline Phosphatase: 79 IU/L (ref 39–117)
BUN/Creatinine Ratio: 13 (ref 9–20)
BUN: 12 mg/dL (ref 6–24)
Bilirubin Total: 0.5 mg/dL (ref 0.0–1.2)
CO2: 19 mmol/L — ABNORMAL LOW (ref 20–29)
Calcium: 9.6 mg/dL (ref 8.7–10.2)
Chloride: 106 mmol/L (ref 96–106)
Creatinine, Ser: 0.92 mg/dL (ref 0.76–1.27)
GFR calc Af Amer: 110 mL/min/{1.73_m2} (ref >59)
GFR calc non Af Amer: 95 mL/min/{1.73_m2} (ref >59)
Globulin, Total: 2.5 g/dL (ref 1.5–4.5)
Glucose: 107 mg/dL — ABNORMAL HIGH (ref 65–99)
Potassium: 4.3 mmol/L (ref 3.5–5.2)
Sodium: 141 mmol/L (ref 134–144)
Total Protein: 6.8 g/dL (ref 6.0–8.5)

## 2019-02-04 LAB — LIPID PANEL
Chol/HDL Ratio: 4.4 ratio (ref 0.0–5.0)
Cholesterol, Total: 223 mg/dL — ABNORMAL HIGH (ref 100–199)
HDL: 51 mg/dL (ref >39)
LDL Chol Calc (NIH): 112 mg/dL — ABNORMAL HIGH (ref 0–99)
Triglycerides: 351 mg/dL — ABNORMAL HIGH (ref 0–149)
VLDL Cholesterol Cal: 60 mg/dL — ABNORMAL HIGH (ref 5–40)

## 2019-02-13 ENCOUNTER — Encounter: Payer: Self-pay | Admitting: Radiology

## 2019-03-27 ENCOUNTER — Other Ambulatory Visit: Payer: Self-pay | Admitting: Family Medicine

## 2019-03-27 DIAGNOSIS — M1A09X0 Idiopathic chronic gout, multiple sites, without tophus (tophi): Secondary | ICD-10-CM

## 2019-03-27 NOTE — Telephone Encounter (Signed)
Refill approved per protocol

## 2019-08-04 ENCOUNTER — Other Ambulatory Visit: Payer: Self-pay

## 2019-08-04 ENCOUNTER — Encounter: Payer: Self-pay | Admitting: Family Medicine

## 2019-08-04 ENCOUNTER — Ambulatory Visit: Payer: BC Managed Care – PPO | Admitting: Family Medicine

## 2019-08-04 VITALS — BP 120/90 | HR 67 | Temp 98.3°F | Ht 70.0 in | Wt 223.8 lb

## 2019-08-04 DIAGNOSIS — M1A09X0 Idiopathic chronic gout, multiple sites, without tophus (tophi): Secondary | ICD-10-CM

## 2019-08-04 DIAGNOSIS — E785 Hyperlipidemia, unspecified: Secondary | ICD-10-CM

## 2019-08-04 DIAGNOSIS — I10 Essential (primary) hypertension: Secondary | ICD-10-CM

## 2019-08-04 MED ORDER — INDOMETHACIN 50 MG PO CAPS: ORAL_CAPSULE | ORAL | 0 refills | Status: DC

## 2019-08-04 MED ORDER — ALLOPURINOL 100 MG PO TABS: 200.0000 mg | ORAL_TABLET | Freq: Every day | ORAL | 1 refills | Status: DC

## 2019-08-04 MED ORDER — AMLODIPINE BESYLATE 5 MG PO TABS: 5.0000 mg | ORAL_TABLET | Freq: Every day | ORAL | 1 refills | Status: DC

## 2019-08-04 NOTE — Patient Instructions (Signed)
° ° ° °  If you have lab work done today you will be contacted with your lab results within the next 2 weeks.  If you have not heard from us then please contact us. The fastest way to get your results is to register for My Chart. ° ° °IF you received an x-ray today, you will receive an invoice from [place] [facility]. Please contact [place] [facility] at [phone] with questions or concerns regarding your invoice.  ° °IF you received labwork today, you will receive an invoice from [facility]. Please contact [facility] at [phone] with questions or concerns regarding your invoice.  ° °Our billing staff will not be able to assist you with questions regarding bills from these companies. ° °You will be contacted with the lab results as soon as they are available. The fastest way to get your results is to activate your My Chart account. Instructions are located on the last page of this paperwork. If you have not heard from us regarding the results in 2 weeks, please contact this office. °  ° ° ° °

## 2019-08-04 NOTE — Progress Notes (Addendum)
Subjective:  Patient ID: Dustin Logan, male    DOB: 09-Nov-1965  Age: 54 y.o. MRN: [id number]  CC:  Chief Complaint  Patient presents with  . Hypertension    6 m f/u   . Hyperlipidemia  . Gout    HPI [name] presents for  Hypertension: Amlodipine 5 mg daily. No missed doses. Home readings:none.  BP Readings from Last 3 Encounters:  08/04/19 (!) 136/96  02/03/19 127/80  11/04/18 136/80   Lab Results  Component Value Date   CREATININE 0.92 02/03/2019   Gout: Last flare:1.5 weeks ago, L foot, treated with indomethacin. Resolved in few days. Prior one long time ago.  Daily meds: Allopurinol 200 mg daily Prn med: Indomethacin Lab Results  Component Value Date   LABURIC 5.4 06/27/2018    Hyperlipidemia: Previous testing in November 2020.  10-year ASCVD risk score 11.2%.  Statin discussed, plan for diet/exercise approach with recheck today. Walking for exercise with work. No changes.  Lab Results  Component Value Date   CHOL 223 (H) 02/03/2019   HDL 51 02/03/2019   LDLCALC 112 (H) 02/03/2019   TRIG 351 (H) 02/03/2019   CHOLHDL 4.4 02/03/2019   Lab Results  Component Value Date   ALT 39 02/03/2019   AST 33 02/03/2019   ALKPHOS 79 02/03/2019   BILITOT 0.5 02/03/2019   Wt Readings from Last 3 Encounters:  08/04/19 223 lb 12.8 oz (101.5 kg)  02/03/19 221 lb 6.4 oz (100.4 kg)  11/04/18 219 lb 6.4 oz (99.5 kg)    covid vaccine - not sure.   History Patient Active Problem List   Diagnosis Date Noted  . Essential hypertension, benign 03/29/2018  . Tourette's syndrome 06/01/2015  . Gout 10/03/2013   Past Medical History:  Diagnosis Date  . Gout attack    Past Surgical History:  Procedure Laterality Date  . HERNIA REPAIR     No Known Allergies Prior to Admission medications   Medication Sig Start Date End Date Taking? Authorizing Provider  allopurinol (ZYLOPRIM) 100 MG tablet TAKE 2 TABLETS BY MOUTH EVERY DAY 03/27/19   [name], MD    amLODipine (NORVASC) 5 MG tablet Take 1 tablet (5 mg total) by mouth daily. 02/03/19   [name], MD  cyclobenzaprine (FLEXERIL) 10 MG tablet Take 1 tablet (10 mg total) by mouth at bedtime. Needs office visit 01/29/18   [name], MD  indomethacin (INDOCIN) 50 MG capsule TAKE 1 CAPSULE BY MOUTH TWICE DAILY WITH A MEAL AS NEEDED FOR GOUT 03/29/18   [name], MD  meloxicam (MOBIC) 7.5 MG tablet Take 1-2 tablets (7.5-15 mg total) by mouth daily as needed for pain. 09/30/18   [name], MD   Social History   Socioeconomic History  . Marital status: Married    Spouse name: Not on file  . Number of children: Not on file  . Years of education: Not on file  . Highest education level: Not on file  Occupational History  . Not on file  Tobacco Use  . Smoking status: Current Some Day Smoker    Types: Cigars  . Smokeless tobacco: Never Used  . Tobacco comment: smokes 10 cigars in a week  Substance and Sexual Activity  . Alcohol use: Yes    Alcohol/week: 12.0 standard drinks    Types: 12 Standard drinks or equivalent per week  . Drug use: No  . Sexual activity: Not on file  Other Topics Concern  .  Not on file  Social History Narrative  . Not on file   Social Determinants of Health   Financial Resource Strain:   . Difficulty of Paying Living Expenses:   Food Insecurity:   . Worried About Charity fundraiser in the Last Year:   . Arboriculturist in the Last Year:   Transportation Needs:   . Film/video editor (Medical):   [name] Lack of Transportation (Non-Medical):   Physical Activity:   . Days of Exercise per Week:   . Minutes of Exercise per Session:   Stress:   . Feeling of Stress :   Social Connections:   . Frequency of Communication with Friends and Family:   . Frequency of Social Gatherings with Friends and Family:   . Attends Religious Services:   . Active Member of Clubs or Organizations:   . Attends Archivist Meetings:   [name] Marital  Status:   Intimate Partner Violence:   . Fear of Current or Ex-Partner:   . Emotionally Abused:   [name] Physically Abused:   . Sexually Abused:     Review of Systems  Constitutional: Negative for fatigue and unexpected weight change.  Eyes: Negative for visual disturbance.  Respiratory: Negative for cough, chest tightness and shortness of breath.   Cardiovascular: Negative for chest pain, palpitations and leg swelling.  Gastrointestinal: Negative for abdominal pain and blood in stool.  Neurological: Negative for dizziness, light-headedness and headaches.     Objective:   Vitals:   08/04/19 0811 08/04/19 0828  BP: (!) 136/96 120/90  Pulse: 67   Temp: 98.3 F (36.8 C)   TempSrc: Temporal   SpO2: 97%   Weight: 223 lb 12.8 oz (101.5 kg)   Height: '5\' 10"'$  (1.778 m)      Physical Exam Vitals reviewed.  Constitutional:      Appearance: He is well-developed.  HENT:     Head: Normocephalic and atraumatic.  Eyes:     Pupils: Pupils are equal, round, and reactive to light.  Neck:     Vascular: No carotid bruit or JVD.  Cardiovascular:     Rate and Rhythm: Normal rate and regular rhythm.     Heart sounds: Normal heart sounds. No murmur.  Pulmonary:     Effort: Pulmonary effort is normal.     Breath sounds: Normal breath sounds. No rales.  Skin:    General: Skin is warm and dry.  Neurological:     Mental Status: He is alert and oriented to person, place, and time.     Assessment & Plan:  [name] is a 54 y.o. male . Hyperlipidemia, unspecified hyperlipidemia type - Plan: Lipid panel, CMP14+EGFR  - check labs. Consider statin if increased   Idiopathic chronic gout of multiple sites without tophus - Plan: Uric acid, indomethacin (INDOCIN) 50 MG capsule, allopurinol (ZYLOPRIM) 100 MG tablet  - recent falre, check uric acid. Controlled prior. Continue allopurinol '200mg'$  qd, indomethacin if needed.   Essential hypertension - Plan: amLODipine (NORVASC) 5 MG tablet  -  borderline but stable. The current medical regimen is effective;  continue present plan and medications.  Covid vaccine recommended, discussed potential concerns and questions.   Meds ordered this encounter  Medications  . indomethacin (INDOCIN) 50 MG capsule    Sig: TAKE 1 CAPSULE BY MOUTH TWICE DAILY WITH A MEAL AS NEEDED FOR GOUT    Dispense:  90 capsule    Refill:  0  . allopurinol (ZYLOPRIM) 100  MG tablet    Sig: Take 2 tablets (200 mg total) by mouth daily.    Dispense:  180 tablet    Refill:  1    DX Code Needed  .  [name] amLODipine (NORVASC) 5 MG tablet    Sig: Take 1 tablet (5 mg total) by mouth daily.    Dispense:  90 tablet    Refill:  1   Patient Instructions       If you have lab work done today you will be contacted with your lab results within the next 2 weeks.  If you have not heard from [place] then please contact us. The fastest way to get your results is to register for My Chart.   IF you received an x-ray today, you will receive an invoice from [facility] Radiology. Please contact [facility] Radiology at [phone] with questions or concerns regarding your invoice.   IF you received labwork today, you will receive an invoice from [place]. Please contact [facility] at [phone] with questions or concerns regarding your invoice.   Our billing staff will not be able to assist you with questions regarding bills from these companies.  You will be contacted with the lab results as soon as they are available. The fastest way to get your results is to activate your My Chart account. Instructions are located on the last page of this paperwork. If you have not heard from [place] regarding the results in 2 weeks, please contact this office.          Signed, Merri Ray, MD [facility]

## 2019-08-05 ENCOUNTER — Ambulatory Visit: Payer: BC Managed Care – PPO | Admitting: Family Medicine

## 2019-08-05 LAB — CMP14+EGFR
ALT: 48 IU/L — ABNORMAL HIGH (ref 0–44)
AST: 41 IU/L — ABNORMAL HIGH (ref 0–40)
Albumin/Globulin Ratio: 2 (ref 1.2–2.2)
Albumin: 4.5 g/dL (ref 3.8–4.9)
Alkaline Phosphatase: 91 IU/L (ref 39–117)
BUN/Creatinine Ratio: 16 (ref 9–20)
BUN: 15 mg/dL (ref 6–24)
Bilirubin Total: 0.4 mg/dL (ref 0.0–1.2)
CO2: 18 mmol/L — ABNORMAL LOW (ref 20–29)
Calcium: 10.1 mg/dL (ref 8.7–10.2)
Chloride: 106 mmol/L (ref 96–106)
Creatinine, Ser: 0.95 mg/dL (ref 0.76–1.27)
GFR calc Af Amer: 106 mL/min/{1.73_m2} (ref >59)
GFR calc non Af Amer: 92 mL/min/{1.73_m2} (ref >59)
Globulin, Total: 2.3 g/dL (ref 1.5–4.5)
Glucose: 114 mg/dL — ABNORMAL HIGH (ref 65–99)
Potassium: 4.9 mmol/L (ref 3.5–5.2)
Sodium: 141 mmol/L (ref 134–144)
Total Protein: 6.8 g/dL (ref 6.0–8.5)

## 2019-08-05 LAB — LIPID PANEL
Chol/HDL Ratio: 4 ratio (ref 0.0–5.0)
Cholesterol, Total: 226 mg/dL — ABNORMAL HIGH (ref 100–199)
HDL: 57 mg/dL (ref >39)
LDL Chol Calc (NIH): 122 mg/dL — ABNORMAL HIGH (ref 0–99)
Triglycerides: 270 mg/dL — ABNORMAL HIGH (ref 0–149)
VLDL Cholesterol Cal: 47 mg/dL — ABNORMAL HIGH (ref 5–40)

## 2019-08-05 LAB — URIC ACID: Uric Acid: 4.9 mg/dL (ref 3.8–8.4)

## 2019-08-13 ENCOUNTER — Encounter: Payer: Self-pay | Admitting: Radiology

## 2019-09-13 ENCOUNTER — Other Ambulatory Visit: Payer: Self-pay | Admitting: Family Medicine

## 2019-09-13 DIAGNOSIS — M1A09X0 Idiopathic chronic gout, multiple sites, without tophus (tophi): Secondary | ICD-10-CM

## 2020-02-05 ENCOUNTER — Encounter: Payer: Self-pay | Admitting: Family Medicine

## 2020-02-05 ENCOUNTER — Ambulatory Visit: Payer: BC Managed Care – PPO | Admitting: Family Medicine

## 2020-02-05 ENCOUNTER — Other Ambulatory Visit: Payer: Self-pay

## 2020-02-05 VITALS — BP 134/88 | HR 97 | Temp 97.3°F | Ht 70.0 in | Wt 225.0 lb

## 2020-02-05 DIAGNOSIS — M1A09X0 Idiopathic chronic gout, multiple sites, without tophus (tophi): Secondary | ICD-10-CM | POA: Diagnosis not present

## 2020-02-05 DIAGNOSIS — R739 Hyperglycemia, unspecified: Secondary | ICD-10-CM

## 2020-02-05 DIAGNOSIS — I10 Essential (primary) hypertension: Secondary | ICD-10-CM | POA: Diagnosis not present

## 2020-02-05 DIAGNOSIS — E785 Hyperlipidemia, unspecified: Secondary | ICD-10-CM | POA: Diagnosis not present

## 2020-02-05 DIAGNOSIS — R7989 Other specified abnormal findings of blood chemistry: Secondary | ICD-10-CM | POA: Diagnosis not present

## 2020-02-05 DIAGNOSIS — R238 Other skin changes: Secondary | ICD-10-CM

## 2020-02-05 MED ORDER — INDOMETHACIN 50 MG PO CAPS: ORAL_CAPSULE | ORAL | 0 refills | Status: DC

## 2020-02-05 MED ORDER — AMLODIPINE BESYLATE 5 MG PO TABS: 5.0000 mg | ORAL_TABLET | Freq: Every day | ORAL | 1 refills | Status: DC

## 2020-02-05 MED ORDER — ALLOPURINOL 100 MG PO TABS: 200.0000 mg | ORAL_TABLET | Freq: Every day | ORAL | 1 refills | Status: DC

## 2020-02-05 NOTE — Patient Instructions (Addendum)
  Cut back on alcohol - no more than 1-2 drinks. We will need to keep an eye on your liver tests.   I will refer you to dermatology to evaluate the ridges of the nails.  No other changes in medications at this time.  Let me know if there are questions.  If you have lab work done today you will be contacted with your lab results within the next 2 weeks.  If you have not heard from [place] then please contact us. The fastest way to get your results is to register for My Chart.   IF you received an x-ray today, you will receive an invoice from [facility] Radiology. Please contact [facility] Radiology at [phone] with questions or concerns regarding your invoice.   IF you received labwork today, you will receive an invoice from [place]. Please contact [facility] at [phone] with questions or concerns regarding your invoice.   Our billing staff will not be able to assist you with questions regarding bills from these companies.  You will be contacted with the lab results as soon as they are available. The fastest way to get your results is to activate your My Chart account. Instructions are located on the last page of this paperwork. If you have not heard from [place] regarding the results in 2 weeks, please contact this office.

## 2020-02-05 NOTE — Progress Notes (Signed)
Subjective:  Patient ID: Dustin Logan, male    DOB: [birth year]  Age: 54 y.o. MRN: [id number]  CC:  Chief Complaint  Patient presents with  . Follow-up    on hypertension,hyperlipidemia, and GOUT. Pt reports no issues with these conditions since last OV. PT reports his current medications seem to work well and he hasn't noticed any side effects.    HPI Dustin Logan presents for    Hypertension: Amlodipine 5 mg daily. Home readings:none, no new side effects.  BP Readings from Last 3 Encounters:  02/05/20 134/88  08/04/19 120/90  02/03/19 127/80   Lab Results  Component Value Date   CREATININE 0.95 08/04/2019   Hyperlipidemia: Slight improvement in May.  No current statin. Has cut back on caffeine and salt in diet. Physical active job.  The 10-year ASCVD risk score Denman George DC Montez Hageman., et al., 2013) is: 11.9%   Values used to calculate the score:     Age: 78 years     Sex: Male     Is Non-Hispanic African American: No     Diabetic: No     Tobacco smoker: Yes     Systolic Blood Pressure: 134 mmHg     Is BP treated: Yes     HDL Cholesterol: 57 mg/dL     Total Cholesterol: 226 mg/dL  Lab Results  Component Value Date   CHOL 226 (H) 08/04/2019   HDL 57 08/04/2019   LDLCALC 122 (H) 08/04/2019   TRIG 270 (H) 08/04/2019   CHOLHDL 4.0 08/04/2019   Lab Results  Component Value Date   ALT 48 (H) 08/04/2019   AST 41 (H) 08/04/2019   ALKPHOS 91 08/04/2019   BILITOT 0.4 08/04/2019    Gout: Last flare:none recent  Daily meds: Allopurinol 200 mg daily, compliant, no new side effects.  Prn med: Indomethacin Lab Results  Component Value Date   LABURIC 4.9 08/04/2019    Hyperglycemia: Glucose 114 at May 10 visit.  Plan for A1c today.  Has had slight increased weight over the past year. Wt Readings from Last 3 Encounters:  02/05/20 225 lb (102.1 kg)  08/04/19 223 lb 12.8 oz (101.5 kg)  02/03/19 221 lb 6.4 oz (100.4 kg)    Elevated LFTs Slight elevation last  visit, avoidance/decrease of alcohol recommended. 4-5 beers at a time, 3 days per week. About the same as in May. Some days none.  No n/v/abd pain/ jaundice.  Lab Results  Component Value Date   ALT 48 (H) 08/04/2019   AST 41 (H) 08/04/2019   ALKPHOS 91 08/04/2019   BILITOT 0.4 08/04/2019   Ridging of thumbnails only.  Past few years. No hx of psoriasis. No plaque on extremities or joitn issues. Toes unaffected. f  History Patient Active Problem List   Diagnosis Date Noted  . Essential hypertension, benign 03/29/2018  . Tourette's syndrome 06/01/2015  . Gout 10/03/2013   Past Medical History:  Diagnosis Date  . Gout attack    Past Surgical History:  Procedure Laterality Date  . HERNIA REPAIR     No Known Allergies Prior to Admission medications   Medication Sig Start Date End Date Taking? Authorizing Provider  allopurinol (ZYLOPRIM) 100 MG tablet Take 2 tablets (200 mg total) by mouth daily. 08/04/19  Yes Shade Flood, MD  amLODipine (NORVASC) 5 MG tablet Take 1 tablet (5 mg total) by mouth daily. 08/04/19  Yes Shade Flood, MD  cyclobenzaprine (FLEXERIL) 10 MG tablet Take 1 tablet (10  mg total) by mouth at bedtime. Needs office visit 01/29/18  Yes Collie Siad A, MD  indomethacin (INDOCIN) 50 MG capsule TAKE 1 CAPSULE BY MOUTH TWICE A DAY WITH A MEAL AS NEEDED FOR GOUT 09/13/19  Yes Shade Flood, MD   Social History   Socioeconomic History  . Marital status: Married    Spouse name: Not on file  . Number of children: Not on file  . Years of education: Not on file  . Highest education level: Not on file  Occupational History  . Not on file  Tobacco Use  . Smoking status: Current Some Day Smoker    Types: Cigars  . Smokeless tobacco: Never Used  . Tobacco comment: smokes 10 cigars in a week  Substance and Sexual Activity  . Alcohol use: Yes    Alcohol/week: 12.0 standard drinks    Types: 12 Standard drinks or equivalent per week  . Drug use: No  .  Sexual activity: Not on file  Other Topics Concern  . Not on file  Social History Narrative  . Not on file   Social Determinants of Health   Financial Resource Strain:   . Difficulty of Paying Living Expenses: Not on file  Food Insecurity:   . Worried About Programme researcher, broadcasting/film/video in the Last Year: Not on file  . Ran Out of Food in the Last Year: Not on file  Transportation Needs:   . Lack of Transportation (Medical): Not on file  . Lack of Transportation (Non-Medical): Not on file  Physical Activity:   . Days of Exercise per Week: Not on file  . Minutes of Exercise per Session: Not on file  Stress:   . Feeling of Stress : Not on file  Social Connections:   . Frequency of Communication with Friends and Family: Not on file  . Frequency of Social Gatherings with Friends and Family: Not on file  . Attends Religious Services: Not on file  . Active Member of Clubs or Organizations: Not on file  . Attends Banker Meetings: Not on file  . Marital Status: Not on file  Intimate Partner Violence:   . Fear of Current or Ex-Partner: Not on file  . Emotionally Abused: Not on file  . Physically Abused: Not on file  . Sexually Abused: Not on file    Review of Systems  Constitutional: Negative for fatigue and unexpected weight change.  Eyes: Negative for visual disturbance.  Respiratory: Negative for cough, chest tightness and shortness of breath.   Cardiovascular: Negative for chest pain, palpitations and leg swelling.  Gastrointestinal: Negative for abdominal pain and blood in stool.  Endocrine: Negative for polydipsia and polyuria.  Genitourinary: Negative for difficulty urinating and frequency.  Neurological: Negative for dizziness, light-headedness and headaches.    Objective:   Vitals:   02/05/20 0759  BP: 134/88  Pulse: 97  Temp: (!) 97.3 F (36.3 C)  TempSrc: Temporal  SpO2: 95%  Weight: 225 lb (102.1 kg)  Height: 5\' 10"  (1.778 m)     Physical  Exam Vitals reviewed.  Constitutional:      Appearance: He is well-developed.  HENT:     Head: Normocephalic and atraumatic.  Eyes:     Pupils: Pupils are equal, round, and reactive to light.  Neck:     Vascular: No carotid bruit or JVD.  Cardiovascular:     Rate and Rhythm: Normal rate and regular rhythm.     Heart sounds: Normal heart sounds. No  murmur heard.   Pulmonary:     Effort: Pulmonary effort is normal.     Breath sounds: Normal breath sounds. No rales.  Skin:    General: Skin is warm and dry.     Comments: Longitudinal  midline ridge of bilateral thumbnails, no d/c, nontender.   Neurological:     Mental Status: He is alert and oriented to person, place, and time.        Assessment & Plan:  Dustin Logan is a 54 y.o. male . Hyperlipidemia, unspecified hyperlipidemia type - Plan: Lipid panel, Comprehensive metabolic panel  -Borderline ASCVD score previously, would consider low-dose statin, recheck lipids.  Idiopathic chronic gout of multiple sites without tophus - Plan: indomethacin (INDOCIN) 50 MG capsule, allopurinol (ZYLOPRIM) 100 MG tablet, Uric acid  -Stable, continue same regimen.  Essential hypertension - Plan: amLODipine (NORVASC) 5 MG tablet, Lipid panel, Comprehensive metabolic panel  -  Stable, tolerating current regimen. Medications refilled. Labs pending as above.   Hyperglycemia - Plan: Hemoglobin A1c  -Check A1c, continue to watch diet, as well as alcohol use.  Elevated LFTs - Plan: Comprehensive metabolic panel  -Repeat LFTs, decreased alcohol recommended.  Longitudinal ridging of nail - Plan: Ambulatory referral to Dermatology  -Possible compression of nail matrix on the right based on appearance, but left side with possible median nail dystrophy.  Will refer to dermatology for further evaluation.  Meds ordered this encounter  Medications  . indomethacin (INDOCIN) 50 MG capsule    Sig: TAKE 1 CAPSULE BY MOUTH TWICE A DAY WITH A MEAL AS  NEEDED FOR GOUT    Dispense:  90 capsule    Refill:  0  . amLODipine (NORVASC) 5 MG tablet    Sig: Take 1 tablet (5 mg total) by mouth daily.    Dispense:  90 tablet    Refill:  1  . allopurinol (ZYLOPRIM) 100 MG tablet    Sig: Take 2 tablets (200 mg total) by mouth daily.    Dispense:  180 tablet    Refill:  1    DX Code Needed  .   Patient Instructions    Cut back on alcohol - no more than 1-2 drinks. We will need to keep an eye on your liver tests.   I will refer you to dermatology to evaluate the ridges of the nails.  No other changes in medications at this time.  Let me know if there are questions.  If you have lab work done today you will be contacted with your lab results within the next 2 weeks.  If you have not heard from [place] then please contact us. The fastest way to get your results is to register for My Chart.   IF you received an x-ray today, you will receive an invoice from [facility] Radiology. Please contact [facility] Radiology at [phone] with questions or concerns regarding your invoice.   IF you received labwork today, you will receive an invoice from [place]. Please contact [facility] at [phone] with questions or concerns regarding your invoice.   Our billing staff will not be able to assist you with questions regarding bills from these companies.  You will be contacted with the lab results as soon as they are available. The fastest way to get your results is to activate your My Chart account. Instructions are located on the last page of this paperwork. If you have not heard from [place] regarding the results in 2 weeks, please contact this office.  Signed, Merri Ray, MD [facility]

## 2020-02-06 LAB — COMPREHENSIVE METABOLIC PANEL
ALT: 56 IU/L — ABNORMAL HIGH (ref 0–44)
AST: 67 IU/L — ABNORMAL HIGH (ref 0–40)
Albumin/Globulin Ratio: 1.6 (ref 1.2–2.2)
Albumin: 4.5 g/dL (ref 3.8–4.9)
Alkaline Phosphatase: 87 IU/L (ref 44–121)
BUN/Creatinine Ratio: 13 (ref 9–20)
BUN: 12 mg/dL (ref 6–24)
Bilirubin Total: 0.3 mg/dL (ref 0.0–1.2)
CO2: 18 mmol/L — ABNORMAL LOW (ref 20–29)
Calcium: 9.8 mg/dL (ref 8.7–10.2)
Chloride: 106 mmol/L (ref 96–106)
Creatinine, Ser: 0.93 mg/dL (ref 0.76–1.27)
GFR calc Af Amer: 108 mL/min/{1.73_m2} (ref >59)
GFR calc non Af Amer: 93 mL/min/{1.73_m2} (ref >59)
Globulin, Total: 2.8 g/dL (ref 1.5–4.5)
Glucose: 85 mg/dL (ref 65–99)
Potassium: 4.3 mmol/L (ref 3.5–5.2)
Sodium: 141 mmol/L (ref 134–144)
Total Protein: 7.3 g/dL (ref 6.0–8.5)

## 2020-02-06 LAB — LIPID PANEL
Chol/HDL Ratio: 5.2 ratio — ABNORMAL HIGH (ref 0.0–5.0)
Cholesterol, Total: 245 mg/dL — ABNORMAL HIGH (ref 100–199)
HDL: 47 mg/dL (ref >39)
LDL Chol Calc (NIH): 107 mg/dL — ABNORMAL HIGH (ref 0–99)
Triglycerides: 530 mg/dL — ABNORMAL HIGH (ref 0–149)
VLDL Cholesterol Cal: 91 mg/dL — ABNORMAL HIGH (ref 5–40)

## 2020-02-06 LAB — HEMOGLOBIN A1C
Est. average glucose Bld gHb Est-mCnc: 123 mg/dL
Hgb A1c MFr Bld: 5.9 % — ABNORMAL HIGH (ref 4.8–5.6)

## 2020-02-06 LAB — URIC ACID: Uric Acid: 5.4 mg/dL (ref 3.8–8.4)

## 2020-07-08 ENCOUNTER — Ambulatory Visit: Payer: Self-pay | Admitting: Physician Assistant

## 2020-08-14 ENCOUNTER — Other Ambulatory Visit: Payer: Self-pay | Admitting: Family Medicine

## 2020-08-14 DIAGNOSIS — I10 Essential (primary) hypertension: Secondary | ICD-10-CM

## 2020-08-23 ENCOUNTER — Other Ambulatory Visit: Payer: Self-pay | Admitting: Family Medicine

## 2020-08-23 DIAGNOSIS — M1A09X0 Idiopathic chronic gout, multiple sites, without tophus (tophi): Secondary | ICD-10-CM

## 2020-09-19 ENCOUNTER — Other Ambulatory Visit: Payer: Self-pay | Admitting: Family Medicine

## 2020-09-19 DIAGNOSIS — M1A09X0 Idiopathic chronic gout, multiple sites, without tophus (tophi): Secondary | ICD-10-CM

## 2020-10-22 ENCOUNTER — Ambulatory Visit (INDEPENDENT_AMBULATORY_CARE_PROVIDER_SITE_OTHER)
Admission: RE | Admit: 2020-10-22 | Discharge: 2020-10-22 | Disposition: A | Discharge status: Home / Self Care | Payer: BC Managed Care – PPO | Source: Ambulatory Visit | Attending: Family Medicine | Admitting: Family Medicine

## 2020-10-22 ENCOUNTER — Other Ambulatory Visit: Payer: Self-pay

## 2020-10-22 ENCOUNTER — Encounter: Payer: Self-pay | Admitting: Family Medicine

## 2020-10-22 ENCOUNTER — Ambulatory Visit: Payer: BC Managed Care – PPO | Admitting: Family Medicine

## 2020-10-22 VITALS — BP 130/90 | HR 72 | Temp 98.8°F | Resp 18 | Ht 70.0 in | Wt 231.8 lb

## 2020-10-22 DIAGNOSIS — M62838 Other muscle spasm: Secondary | ICD-10-CM

## 2020-10-22 DIAGNOSIS — R7303 Prediabetes: Secondary | ICD-10-CM

## 2020-10-22 DIAGNOSIS — R7989 Other specified abnormal findings of blood chemistry: Secondary | ICD-10-CM

## 2020-10-22 DIAGNOSIS — E785 Hyperlipidemia, unspecified: Secondary | ICD-10-CM | POA: Diagnosis not present

## 2020-10-22 DIAGNOSIS — M1A09X0 Idiopathic chronic gout, multiple sites, without tophus (tophi): Secondary | ICD-10-CM | POA: Diagnosis not present

## 2020-10-22 DIAGNOSIS — M25511 Pain in right shoulder: Secondary | ICD-10-CM | POA: Diagnosis not present

## 2020-10-22 LAB — COMPREHENSIVE METABOLIC PANEL
ALT: 53 U/L (ref 0–53)
AST: 72 U/L — ABNORMAL HIGH (ref 0–37)
Albumin: 4.4 g/dL (ref 3.5–5.2)
Alkaline Phosphatase: 82 U/L (ref 39–117)
BUN: 10 mg/dL (ref 6–23)
CO2: 25 mEq/L (ref 19–32)
Calcium: 9.7 mg/dL (ref 8.4–10.5)
Chloride: 105 mEq/L (ref 96–112)
Creatinine, Ser: 0.87 mg/dL (ref 0.40–1.50)
GFR: 98.18 mL/min (ref >60.00)
Glucose, Bld: 95 mg/dL (ref 70–99)
Potassium: 4.3 mEq/L (ref 3.5–5.1)
Sodium: 138 mEq/L (ref 135–145)
Total Bilirubin: 0.6 mg/dL (ref 0.2–1.2)
Total Protein: 7 g/dL (ref 6.0–8.3)

## 2020-10-22 LAB — LIPID PANEL
Cholesterol: 206 mg/dL — ABNORMAL HIGH (ref 0–200)
HDL: 49.5 mg/dL (ref >39.00)
NonHDL: 156.01
Total CHOL/HDL Ratio: 4
Triglycerides: 379 mg/dL — ABNORMAL HIGH (ref 0.0–149.0)
VLDL: 75.8 mg/dL — ABNORMAL HIGH (ref 0.0–40.0)

## 2020-10-22 LAB — HEMOGLOBIN A1C: Hgb A1c MFr Bld: 5.9 % (ref 4.6–6.5)

## 2020-10-22 LAB — LDL CHOLESTEROL, DIRECT: Direct LDL: 116 mg/dL

## 2020-10-22 MED ORDER — CYCLOBENZAPRINE HCL 10 MG PO TABS: 10.0000 mg | ORAL_TABLET | Freq: Every day | ORAL | 0 refills | Status: DC

## 2020-10-22 MED ORDER — INDOMETHACIN 50 MG PO CAPS: ORAL_CAPSULE | ORAL | 0 refills | Status: DC

## 2020-10-22 MED ORDER — MELOXICAM 7.5 MG PO TABS: 7.5000 mg | ORAL_TABLET | Freq: Every day | ORAL | 0 refills | Status: DC

## 2020-10-22 NOTE — Progress Notes (Addendum)
Subjective:  Patient ID: Dustin Logan, male    DOB: 1965-05-28  Age: 55 y.o. MRN: [id number]  CC:  Chief Complaint  Patient presents with   Follow-up    Medication recheck    HPI Dustin Logan presents for   Flexeril refill Uses as needed for spasm in neck/shoulder or low back if needed. Physical work. Rare use. Has been having some R shoulder pain - worse past few months. Discussed possible deltoid injury in 2020 - recommended ortho eval - has not seen. more sore some days then others. No recent injury. Occasional alleve. Physical work with digging holes. No neck pain, no weakness. R hand dominant.  No hx of PUD.  Lab Results  Component Value Date   CREATININE 0.93 02/05/2020     Hyperlipidemia: Recommended statin in November - did not call back. Meds last visit but with history of elevated LFTs recommended repeat testing in 6 weeks.  Has not had repeat labs since November.  Currently taking Lab Results  Component Value Date   CHOL 245 (H) 02/05/2020   HDL 47 02/05/2020   LDLCALC 107 (H) 02/05/2020   TRIG 530 (H) 02/05/2020   CHOLHDL 5.2 (H) 02/05/2020   Lab Results  Component Value Date   ALT 56 (H) 02/05/2020   AST 67 (H) 02/05/2020   ALKPHOS 87 02/05/2020   BILITOT 0.3 02/05/2020   Gout: Last flare: none recently.  Daily meds: Allopurinol 200 mg daily. Prn med: Indomethacin Lab Results  Component Value Date   LABURIC 5.4 02/05/2020   Prediabetes: Lifestyle modification discussed previously.  Did have some slight weight gain last year, further gain today from 225-231. No changes in diet/exercise.  Some sweet tea, soda.  Fast food - 3 times per week.  Lab Results  Component Value Date   HGBA1C 5.9 (H) 02/05/2020   Wt Readings from Last 3 Encounters:  10/22/20 231 lb 12.8 oz (105.1 kg)  02/05/20 225 lb (102.1 kg)  08/04/19 223 lb 12.8 oz (101.5 kg)   History of elevated LFTs No prior nausea/vomiting/abdominal pain or jaundice.  4-5 beers at a time  3 days/week when discussed previously.  Decreased use recommended. LFTs had increased slightly last visit. Has cut back on alcohol - 1-2 times per week - 3-4 beers at a time.  No abd pain/n/v/jaudnice.   Lab Results  Component Value Date   ALT 56 (H) 02/05/2020   AST 67 (H) 02/05/2020   ALKPHOS 87 02/05/2020   BILITOT 0.3 02/05/2020     History Patient Active Problem List   Diagnosis Date Noted   Essential hypertension, benign 03/29/2018   Tourette's syndrome 06/01/2015   Gout 10/03/2013   Past Medical History:  Diagnosis Date   Gout attack    Past Surgical History:  Procedure Laterality Date   HERNIA REPAIR     No Known Allergies Prior to Admission medications   Medication Sig Start Date End Date Taking? Authorizing Provider  allopurinol (ZYLOPRIM) 100 MG tablet TAKE 2 TABLETS BY MOUTH EVERY DAY 08/24/20  Yes Shade Flood, MD  amLODipine (NORVASC) 5 MG tablet TAKE 1 TABLET BY MOUTH EVERY DAY 08/16/20  Yes Shade Flood, MD  cyclobenzaprine (FLEXERIL) 10 MG tablet Take 1 tablet (10 mg total) by mouth at bedtime. Needs office visit 01/29/18  Yes Collie Siad A, MD  indomethacin (INDOCIN) 50 MG capsule TAKE 1 CAPSULE BY MOUTH TWICE A DAY WITH A MEAL AS NEEDED FOR GOUT 02/05/20  Yes Meredith Staggers  R, MD   Social History   Socioeconomic History   Marital status: Married    Spouse name: Not on file   Number of children: Not on file   Years of education: Not on file   Highest education level: Not on file  Occupational History   Not on file  Tobacco Use   Smoking status: Some Days    Types: Cigars   Smokeless tobacco: Never   Tobacco comments:    smokes 10 cigars in a week  Substance and Sexual Activity   Alcohol use: Yes    Alcohol/week: 12.0 standard drinks    Types: 12 Standard drinks or equivalent per week   Drug use: No   Sexual activity: Not on file  Other Topics Concern   Not on file  Social History Narrative   Not on file   Social Determinants  of Health   Financial Resource Strain: Not on file  Food Insecurity: Not on file  Transportation Needs: Not on file  Physical Activity: Not on file  Stress: Not on file  Social Connections: Not on file  Intimate Partner Violence: Not on file    Review of Systems  Constitutional:  Negative for fatigue and unexpected weight change.  Eyes:  Negative for visual disturbance.  Respiratory:  Negative for cough, chest tightness and shortness of breath.   Cardiovascular:  Negative for chest pain, palpitations and leg swelling.  Gastrointestinal:  Negative for abdominal pain and blood in stool.  Neurological:  Negative for dizziness, light-headedness and headaches.    Objective:   Vitals:   10/22/20 1030  BP: 130/90  Pulse: 72  Resp: 18  Temp: 98.8 F (37.1 C)  TempSrc: Temporal  SpO2: 97%  Weight: 231 lb 12.8 oz (105.1 kg)  Height: 5\' 10"  (1.778 m)     Physical Exam Vitals reviewed.  Constitutional:      Appearance: He is well-developed.  HENT:     Head: Normocephalic and atraumatic.  Neck:     Vascular: No carotid bruit or JVD.  Cardiovascular:     Rate and Rhythm: Normal rate and regular rhythm.     Heart sounds: Normal heart sounds. No murmur heard. Pulmonary:     Effort: Pulmonary effort is normal.     Breath sounds: Normal breath sounds. No rales.  Musculoskeletal:     Right lower leg: No edema.     Left lower leg: No edema.     Comments: Nontender.  Area discomfort appears to be primarily at the upper trapezius. Right shoulder, no focal bony tenderness, AC, [place], clavicle nontender, deltoid nontender.  Full range of motion and full rotator cuff strength.  Skin:    General: Skin is warm and dry.  Neurological:     Mental Status: He is alert and oriented to person, place, and time.  Psychiatric:        Mood and Affect: Mood normal.  DG Shoulder Right  Result Date: 10/22/2020 CLINICAL DATA:  Pain x6 months EXAM: RIGHT SHOULDER - 2+ VIEW COMPARISON:  None.  FINDINGS: There is no evidence of fracture or dislocation. There is no evidence of arthropathy or other focal bone abnormality. Soft tissues are unremarkable. IMPRESSION: Negative. Electronically Signed   By: 10/24/2020 M.D.   On: 10/22/2020 12:42     Assessment & Plan:  Dustin Logan is a 55 y.o. male . Elevated LFTs - Plan: Comprehensive metabolic panel  -Continue to recommend decrease alcohol but commended on his decreased amount to  this visit.  Repeat labs.  Consider right upper quadrant ultrasound.  Idiopathic chronic gout of multiple sites without tophus - Plan: indomethacin (INDOCIN) 50 MG capsule  Stable, continue allopurinol with indomethacin only if needed for breakthrough pain.  Acute pain of right shoulder - Plan: meloxicam (MOBIC) 7.5 MG tablet, DG Shoulder Right Trapezius muscle spasm - Plan: cyclobenzaprine (FLEXERIL) 10 MG tablet  -Previously thought to be deltoid issue but deltoid nontender on exam.  Full range of motion and rotator cuff overall reassuring.  X-ray reassuring.  Plan of trapezius spasm as cause of pain versus overuse component.  Trial of short-term meloxicam without other NSAIDs or indomethacin and avoid alcohol.  Refilled Flexeril if needed as well.  Recheck next 2 weeks to decide on orthopedic eval.  Prediabetes - Plan: Hemoglobin A1c Lifestyle modification discussed, recheck A1c  Hyperlipidemia, unspecified hyperlipidemia type - Plan: Comprehensive metabolic panel, Lipid panel Repeat labs, consider statin but with elevated LFTs may need to closely monitor, work-up as above.  Lifestyle modification discussed as above.  Follow-up in next few weeks to discuss labs and plan further.   Meds ordered this encounter  Medications   cyclobenzaprine (FLEXERIL) 10 MG tablet    Sig: Take 1 tablet (10 mg total) by mouth at bedtime. Needs office visit    Dispense:  30 tablet    Refill:  0   indomethacin (INDOCIN) 50 MG capsule    Sig: TAKE 1 CAPSULE BY MOUTH TWICE A  DAY WITH A MEAL AS NEEDED FOR GOUT    Dispense:  90 capsule    Refill:  0   meloxicam (MOBIC) 7.5 MG tablet    Sig: Take 1 tablet (7.5 mg total) by mouth daily.    Dispense:  30 tablet    Refill:  0   Patient Instructions  Blood sugar and cholesterol were high last year. Cut back on sugar containing beverages and fast food. I will check some labs today and follow up to discuss plan for new meds possibly.  Keep up the good work with cutting back on alcohol, but try to decrease further. I will check liver tests again today.   Shoulder pain may be related to some muscle spasm of the trapezius or upper back muscle.  Try meloxicam once per day.  I do not recommend drinking alcohol with that medication, and do not take indomethacin or Advil/Aleve while you are taking meloxicam.  Okay to try the muscle relaxant Flexeril at bedtime temporarily as well as gentle range of motion of the neck and arms throughout the day.  Recheck in 2 weeks to review labs and recheck shoulder at that time.  Potentially may need to see orthopedics but we can discuss that further.   Return to the clinic or go to the nearest emergency room if any of your symptoms worsen or new symptoms occur.      Signed,   Meredith Staggers, MD [facility], [facility] 10/22/20 11:21 AM

## 2020-10-22 NOTE — Patient Instructions (Addendum)
Blood sugar and cholesterol were high last year. Cut back on sugar containing beverages and fast food. I will check some labs today and follow up to discuss plan for new meds possibly.  Keep up the good work with cutting back on alcohol, but try to decrease further. I will check liver tests again today.   Shoulder pain may be related to some muscle spasm of the trapezius or upper back muscle.  Try meloxicam once per day.  I do not recommend drinking alcohol with that medication, and do not take indomethacin or Advil/Aleve while you are taking meloxicam.  Okay to try the muscle relaxant Flexeril at bedtime temporarily as well as gentle range of motion of the neck and arms throughout the day.  Recheck in 2 weeks to review labs and recheck shoulder at that time.  Potentially may need to see orthopedics but we can discuss that further.   Return to the clinic or go to the nearest emergency room if any of your symptoms worsen or new symptoms occur.

## 2020-10-23 ENCOUNTER — Other Ambulatory Visit: Payer: Self-pay | Admitting: Family Medicine

## 2020-10-23 DIAGNOSIS — M1A09X0 Idiopathic chronic gout, multiple sites, without tophus (tophi): Secondary | ICD-10-CM

## 2020-10-25 ENCOUNTER — Other Ambulatory Visit: Payer: Self-pay

## 2020-10-25 ENCOUNTER — Telehealth: Payer: Self-pay | Admitting: Family Medicine

## 2020-10-25 DIAGNOSIS — M1A09X0 Idiopathic chronic gout, multiple sites, without tophus (tophi): Secondary | ICD-10-CM

## 2020-10-25 MED ORDER — ALLOPURINOL 100 MG PO TABS: 200.0000 mg | ORAL_TABLET | Freq: Every day | ORAL | 3 refills | Status: DC

## 2020-10-25 NOTE — Telephone Encounter (Signed)
Patient needs to pick up his allopurinol - he is completely out - Please send to [facility] on [address] -   He saw Dr. Neva Seat last week.

## 2020-10-25 NOTE — Telephone Encounter (Signed)
Medication sent to pharmacy  

## 2020-11-10 ENCOUNTER — Other Ambulatory Visit: Payer: Self-pay

## 2020-11-10 ENCOUNTER — Ambulatory Visit: Payer: BC Managed Care – PPO | Admitting: Family Medicine

## 2020-11-10 VITALS — BP 132/78 | HR 65 | Temp 98.2°F | Resp 17 | Ht 70.0 in | Wt 230.2 lb

## 2020-11-10 DIAGNOSIS — M25511 Pain in right shoulder: Secondary | ICD-10-CM

## 2020-11-10 DIAGNOSIS — I10 Essential (primary) hypertension: Secondary | ICD-10-CM

## 2020-11-10 DIAGNOSIS — Z1211 Encounter for screening for malignant neoplasm of colon: Secondary | ICD-10-CM | POA: Diagnosis not present

## 2020-11-10 DIAGNOSIS — E781 Pure hyperglyceridemia: Secondary | ICD-10-CM | POA: Diagnosis not present

## 2020-11-10 DIAGNOSIS — M62838 Other muscle spasm: Secondary | ICD-10-CM | POA: Diagnosis not present

## 2020-11-10 DIAGNOSIS — R7989 Other specified abnormal findings of blood chemistry: Secondary | ICD-10-CM

## 2020-11-10 LAB — LIPID PANEL
Cholesterol: 213 mg/dL — ABNORMAL HIGH (ref 0–200)
HDL: 58.9 mg/dL (ref >39.00)
LDL Cholesterol: 115 mg/dL — ABNORMAL HIGH (ref 0–99)
NonHDL: 154.59
Total CHOL/HDL Ratio: 4
Triglycerides: 196 mg/dL — ABNORMAL HIGH (ref 0.0–149.0)
VLDL: 39.2 mg/dL (ref 0.0–40.0)

## 2020-11-10 MED ORDER — AMLODIPINE BESYLATE 5 MG PO TABS: 5.0000 mg | ORAL_TABLET | Freq: Every day | ORAL | 0 refills | Status: DC

## 2020-11-10 NOTE — Patient Instructions (Addendum)
If muscle gets tight or spasm - can try cyclobenzaprine temporarily, or meloxicam if needed temporarily. If persistent pain or continued need for those meds, I would recommend follow up with orthopaedist.   Continue to cut back on alcohol.  I will recheck your cholesterol test today and if the triglycerides are still high we can discuss if medication needed.  Keep up the good work with drinking more water, and weight loss.  Recheck in 5 months but let me know if there are questions sooner.

## 2020-11-10 NOTE — Progress Notes (Signed)
Subjective:  Patient ID: Dustin Logan, male    DOB: 09/17/1965  Age: 55 y.o. MRN: [id number]  CC:  Chief Complaint  Patient presents with   Hypertension    Pt here to recheck no concerns, pt reports no physical sxs, may need refills, discuss lab results     HPI Dustin Logan presents for   Hypertension: Borderline last visit. Has increased water intake, less soda.  Norvasc 5mg  qd.  BP Readings from Last 3 Encounters:  11/10/20 132/78  10/22/20 130/90  02/05/20 134/88   Lab Results  Component Value Date   CREATININE 0.87 10/22/2020   Right shoulder pain with suspected trapezius spasm.   Thought to be deltoid issue previously.  See last visit July 29.  Deltoid was okay at that time, full range of motion and rotator cuff exam reassuring.  X-ray was reassuring.  Treated with short-term meloxicam for possible trapezius spasm versus overuse component as well as Flexeril if needed. Has not needed mobic or flexeril - shoulder feeling better.   Hyperlipidemia: Not sure if fasting at last visit. Cutting back on sodas today.  Nothing to eat yet today.  Borderline elevated lft. Cutting back on alcohol. No n/v/abd pain or yellowing of skin or eyes.  Last colonoscopy 05/2009 - Dr. 06/2009, repeat 10 yrs.  Wt Readings from Last 3 Encounters:  11/10/20 230 lb 3.2 oz (104.4 kg)  10/22/20 231 lb 12.8 oz (105.1 kg)  02/05/20 225 lb (102.1 kg)    Lab Results  Component Value Date   CHOL 206 (H) 10/22/2020   HDL 49.50 10/22/2020   LDLCALC 107 (H) 02/05/2020   LDLDIRECT 116.0 10/22/2020   TRIG 379.0 (H) 10/22/2020   CHOLHDL 4 10/22/2020   Lab Results  Component Value Date   ALT 53 10/22/2020   AST 72 (H) 10/22/2020   ALKPHOS 82 10/22/2020   BILITOT 0.6 10/22/2020     History Patient Active Problem List   Diagnosis Date Noted   Essential hypertension, benign 03/29/2018   Tourette's syndrome 06/01/2015   Gout 10/03/2013   Past Medical History:  Diagnosis Date   Gout  attack    Past Surgical History:  Procedure Laterality Date   HERNIA REPAIR     No Known Allergies Prior to Admission medications   Medication Sig Start Date End Date Taking? Authorizing Provider  allopurinol (ZYLOPRIM) 100 MG tablet Take 2 tablets (200 mg total) by mouth daily. 10/25/20  Yes 12/25/20, MD  amLODipine (NORVASC) 5 MG tablet TAKE 1 TABLET BY MOUTH EVERY DAY 08/16/20  Yes 08/18/20, MD  cyclobenzaprine (FLEXERIL) 10 MG tablet Take 1 tablet (10 mg total) by mouth at bedtime. Needs office visit 10/22/20  Yes 10/24/20, MD  indomethacin (INDOCIN) 50 MG capsule TAKE 1 CAPSULE BY MOUTH TWICE A DAY WITH A MEAL AS NEEDED FOR GOUT 10/22/20  Yes 10/24/20, MD  meloxicam (MOBIC) 7.5 MG tablet Take 1 tablet (7.5 mg total) by mouth daily. 10/22/20  Yes 10/24/20, MD   Social History   Socioeconomic History   Marital status: Married    Spouse name: Not on file   Number of children: Not on file   Years of education: Not on file   Highest education level: Not on file  Occupational History   Not on file  Tobacco Use   Smoking status: Some Days    Types: Cigars   Smokeless tobacco: Never   Tobacco comments:  smokes 10 cigars in a week  Substance and Sexual Activity   Alcohol use: Yes    Alcohol/week: 12.0 standard drinks    Types: 12 Standard drinks or equivalent per week   Drug use: No   Sexual activity: Not on file  Other Topics Concern   Not on file  Social History Narrative   Not on file   Social Determinants of Health   Financial Resource Strain: Not on file  Food Insecurity: Not on file  Transportation Needs: Not on file  Physical Activity: Not on file  Stress: Not on file  Social Connections: Not on file  Intimate Partner Violence: Not on file    Review of Systems  Per HPI.  Objective:   Vitals:   11/10/20 1140  BP: 132/78  Pulse: 65  Resp: 17  Temp: 98.2 F (36.8 C)  TempSrc: Temporal  SpO2: 97%  Weight: 230  lb 3.2 oz (104.4 kg)  Height: 5\' 10"  (1.778 m)     Physical Exam Vitals reviewed.  Constitutional:      Appearance: He is well-developed.  HENT:     Head: Normocephalic and atraumatic.  Neck:     Vascular: No carotid bruit or JVD.  Cardiovascular:     Rate and Rhythm: Normal rate and regular rhythm.     Heart sounds: Normal heart sounds. No murmur heard. Pulmonary:     Effort: Pulmonary effort is normal.     Breath sounds: Normal breath sounds. No rales.  Abdominal:     General: Abdomen is flat.     Palpations: Abdomen is soft. There is no mass.     Tenderness: There is no abdominal tenderness. There is no guarding.  Musculoskeletal:     Right lower leg: No edema.     Left lower leg: No edema.     Comments: Right shoulder nontender, full range of motion.  No bony tenderness no muscular spasm appreciated.full rtc strength, negative Hawkins, Neer, empty can.  Skin:    General: Skin is warm and dry.     Coloration: Skin is not jaundiced.  Neurological:     Mental Status: He is alert and oriented to person, place, and time.  Psychiatric:        Mood and Affect: Mood normal.       Assessment & Plan:  Dustin Logan is a 55 y.o. male . Acute pain of right shoulder Trapezius muscle spasm Improved. Option of short term flexeril. Mobic if needed. Consider ortho eval if persistent/recurrent.   Essential hypertension - Plan: amLODipine (NORVASC) 5 MG tablet  Stable, tolerating current regimen. Medications refilled.   Screen for colon cancer - Plan: Ambulatory referral to Gastroenterology  Elevated LFTs  - continue to monitor alcohol intake with decreased use.   Hypertriglyceridemia - Plan: Lipid panel  - repeat labs with option of meds. Continue lifestyle modifications.   Meds ordered this encounter  Medications   amLODipine (NORVASC) 5 MG tablet    Sig: Take 1 tablet (5 mg total) by mouth daily.    Dispense:  90 tablet    Refill:  0   Patient Instructions  If  muscle gets tight or spasm - can try cyclobenzaprine temporarily, or meloxicam if needed temporarily. If persistent pain or continued need for those meds, I would recommend follow up with orthopaedist.   Continue to cut back on alcohol.  I will recheck your cholesterol test today and if the triglycerides are still high we can discuss if medication  needed.  Keep up the good work with drinking more water, and weight loss.  Recheck in 5 months but let me know if there are questions sooner.    Signed,   Meredith Staggers, MD [facility], [facility] 11/10/20 11:55 AM

## 2020-11-21 ENCOUNTER — Other Ambulatory Visit: Payer: Self-pay | Admitting: Family Medicine

## 2020-11-21 DIAGNOSIS — M25511 Pain in right shoulder: Secondary | ICD-10-CM

## 2020-11-22 DIAGNOSIS — Z1211 Encounter for screening for malignant neoplasm of colon: Secondary | ICD-10-CM | POA: Diagnosis not present

## 2020-12-06 ENCOUNTER — Other Ambulatory Visit: Payer: Self-pay | Admitting: Family Medicine

## 2020-12-06 DIAGNOSIS — M1A09X0 Idiopathic chronic gout, multiple sites, without tophus (tophi): Secondary | ICD-10-CM

## 2020-12-28 DIAGNOSIS — D120 Benign neoplasm of cecum: Secondary | ICD-10-CM | POA: Diagnosis not present

## 2020-12-28 DIAGNOSIS — D123 Benign neoplasm of transverse colon: Secondary | ICD-10-CM | POA: Diagnosis not present

## 2020-12-28 DIAGNOSIS — D125 Benign neoplasm of sigmoid colon: Secondary | ICD-10-CM | POA: Diagnosis not present

## 2020-12-28 DIAGNOSIS — Z1211 Encounter for screening for malignant neoplasm of colon: Secondary | ICD-10-CM | POA: Diagnosis not present

## 2020-12-28 DIAGNOSIS — K635 Polyp of colon: Secondary | ICD-10-CM | POA: Diagnosis not present

## 2020-12-28 LAB — HM COLONOSCOPY

## 2021-01-02 ENCOUNTER — Other Ambulatory Visit: Payer: Self-pay | Admitting: Family Medicine

## 2021-01-02 DIAGNOSIS — I10 Essential (primary) hypertension: Secondary | ICD-10-CM

## 2021-01-20 ENCOUNTER — Other Ambulatory Visit: Payer: Self-pay | Admitting: Family Medicine

## 2021-01-20 DIAGNOSIS — M1A09X0 Idiopathic chronic gout, multiple sites, without tophus (tophi): Secondary | ICD-10-CM

## 2021-01-24 ENCOUNTER — Other Ambulatory Visit: Payer: Self-pay | Admitting: Family Medicine

## 2021-01-24 DIAGNOSIS — M1A09X0 Idiopathic chronic gout, multiple sites, without tophus (tophi): Secondary | ICD-10-CM

## 2021-03-12 ENCOUNTER — Other Ambulatory Visit: Payer: Self-pay | Admitting: Family Medicine

## 2021-03-12 DIAGNOSIS — M1A09X0 Idiopathic chronic gout, multiple sites, without tophus (tophi): Secondary | ICD-10-CM

## 2021-04-04 ENCOUNTER — Other Ambulatory Visit: Payer: Self-pay | Admitting: Family Medicine

## 2021-04-04 DIAGNOSIS — I10 Essential (primary) hypertension: Secondary | ICD-10-CM

## 2021-04-14 ENCOUNTER — Ambulatory Visit: Payer: BC Managed Care – PPO | Admitting: Family Medicine

## 2021-04-14 VITALS — BP 132/80 | HR 60 | Temp 98.0°F | Resp 16 | Ht 70.0 in | Wt 226.6 lb

## 2021-04-14 DIAGNOSIS — I10 Essential (primary) hypertension: Secondary | ICD-10-CM | POA: Diagnosis not present

## 2021-04-14 DIAGNOSIS — E785 Hyperlipidemia, unspecified: Secondary | ICD-10-CM

## 2021-04-14 DIAGNOSIS — R7303 Prediabetes: Secondary | ICD-10-CM

## 2021-04-14 DIAGNOSIS — M1A09X0 Idiopathic chronic gout, multiple sites, without tophus (tophi): Secondary | ICD-10-CM

## 2021-04-14 DIAGNOSIS — G4700 Insomnia, unspecified: Secondary | ICD-10-CM

## 2021-04-14 DIAGNOSIS — R7989 Other specified abnormal findings of blood chemistry: Secondary | ICD-10-CM

## 2021-04-14 DIAGNOSIS — Z23 Encounter for immunization: Secondary | ICD-10-CM | POA: Diagnosis not present

## 2021-04-14 LAB — LIPID PANEL
Cholesterol: 215 mg/dL — ABNORMAL HIGH (ref 0–200)
HDL: 53.4 mg/dL (ref >39.00)
LDL Cholesterol: 127 mg/dL — ABNORMAL HIGH (ref 0–99)
NonHDL: 161.98
Total CHOL/HDL Ratio: 4
Triglycerides: 177 mg/dL — ABNORMAL HIGH (ref 0.0–149.0)
VLDL: 35.4 mg/dL (ref 0.0–40.0)

## 2021-04-14 LAB — COMPREHENSIVE METABOLIC PANEL
ALT: 62 U/L — ABNORMAL HIGH (ref 0–53)
AST: 54 U/L — ABNORMAL HIGH (ref 0–37)
Albumin: 4.4 g/dL (ref 3.5–5.2)
Alkaline Phosphatase: 79 U/L (ref 39–117)
BUN: 14 mg/dL (ref 6–23)
CO2: 26 mEq/L (ref 19–32)
Calcium: 9.7 mg/dL (ref 8.4–10.5)
Chloride: 107 mEq/L (ref 96–112)
Creatinine, Ser: 0.95 mg/dL (ref 0.40–1.50)
GFR: 90.77 mL/min (ref >60.00)
Glucose, Bld: 104 mg/dL — ABNORMAL HIGH (ref 70–99)
Potassium: 4.2 mEq/L (ref 3.5–5.1)
Sodium: 140 mEq/L (ref 135–145)
Total Bilirubin: 0.6 mg/dL (ref 0.2–1.2)
Total Protein: 7.1 g/dL (ref 6.0–8.3)

## 2021-04-14 LAB — HEMOGLOBIN A1C: Hgb A1c MFr Bld: 5.8 % (ref 4.6–6.5)

## 2021-04-14 MED ORDER — AMLODIPINE BESYLATE 5 MG PO TABS: 5.0000 mg | ORAL_TABLET | Freq: Every day | ORAL | 1 refills | Status: DC

## 2021-04-14 MED ORDER — HYDROXYZINE HCL 10 MG PO TABS: 10.0000 mg | ORAL_TABLET | Freq: Every evening | ORAL | 0 refills | Status: DC | PRN

## 2021-04-14 MED ORDER — ALLOPURINOL 100 MG PO TABS: 200.0000 mg | ORAL_TABLET | Freq: Every day | ORAL | 1 refills | Status: DC

## 2021-04-14 NOTE — Patient Instructions (Addendum)
See info on sleep below, make a list to lessen thoughts of work late at night. try hydroxyzine if needed to relax or get to sleep at night. Do not combine with alcohol. Follow up if you continue to have nighttime wakening as may need to look into other causes like sleep apnea.  Keep up the good work watching diet, and staying active.  Weight has improved.  I will check your lab work and expected to be better.  Continue to minimize alcohol use as possible, especially if the liver tests remain elevated.  Follow-up with me in 6 months unless persistent difficulty with sleep or persistent need for the sleep medication, then follow-up sooner and we can discuss other options if needed.  Thanks for coming in today.   Insomnia Insomnia is a sleep disorder that makes it difficult to fall asleep or stay asleep. Insomnia can cause fatigue, low energy, difficulty concentrating, mood swings, and poor performance at work or school. There are three different ways to classify insomnia: Difficulty falling asleep. Difficulty staying asleep. Waking up too early in the morning. Any type of insomnia can be long-term (chronic) or short-term (acute). Both are common. Short-term insomnia usually lasts for three months or less. Chronic insomnia occurs at least three times a week for longer than three months. What are the causes? Insomnia may be caused by another condition, situation, or substance, such as: Anxiety. Certain medicines. Gastroesophageal reflux disease (GERD) or other gastrointestinal conditions. Asthma or other breathing conditions. Restless legs syndrome, sleep apnea, or other sleep disorders. Chronic pain. Menopause. Stroke. Abuse of alcohol, tobacco, or illegal drugs. Mental health conditions, such as depression. Caffeine. Neurological disorders, such as Alzheimer's disease. An overactive thyroid (hyperthyroidism). Sometimes, the cause of insomnia may not be known. What increases the  risk? Risk factors for insomnia include: Gender. Women are affected more often than men. Age. Insomnia is more common as you get older. Stress. Lack of exercise. Irregular work schedule or working night shifts. Traveling between different time zones. Certain medical and mental health conditions. What are the signs or symptoms? If you have insomnia, the main symptom is having trouble falling asleep or having trouble staying asleep. This may lead to other symptoms, such as: Feeling fatigued or having low energy. Feeling nervous about going to sleep. Not feeling rested in the morning. Having trouble concentrating. Feeling irritable, anxious, or depressed. How is this diagnosed? This condition may be diagnosed based on: Your symptoms and medical history. Your health care provider may ask about: Your sleep habits. Any medical conditions you have. Your mental health. A physical exam. How is this treated? Treatment for insomnia depends on the cause. Treatment may focus on treating an underlying condition that is causing insomnia. Treatment may also include: Medicines to help you sleep. Counseling or therapy. Lifestyle adjustments to help you sleep better. Follow these instructions at home: Eating and drinking  Limit or avoid alcohol, caffeinated beverages, and cigarettes, especially close to bedtime. These can disrupt your sleep. Do not eat a large meal or eat spicy foods right before bedtime. This can lead to digestive discomfort that can make it hard for you to sleep. Sleep habits  Keep a sleep diary to help you and your health care provider figure out what could be causing your insomnia. Write down: When you sleep. When you wake up during the night. How well you sleep. How rested you feel the next day. Any side effects of medicines you are taking. What you eat and drink.  Make your bedroom a dark, comfortable place where it is easy to fall asleep. Put up shades or blackout  curtains to block light from outside. Use a white noise machine to block noise. Keep the temperature cool. Limit screen use before bedtime. This includes: Watching TV. Using your smartphone, tablet, or computer. Stick to a routine that includes going to bed and waking up at the same times every day and night. This can help you fall asleep faster. Consider making a quiet activity, such as reading, part of your nighttime routine. Try to avoid taking naps during the day so that you sleep better at night. Get out of bed if you are still awake after 15 minutes of trying to sleep. Keep the lights down, but try reading or doing a quiet activity. When you feel sleepy, go back to bed. General instructions Take over-the-counter and prescription medicines only as told by your health care provider. Exercise regularly, as told by your health care provider. Avoid exercise starting several hours before bedtime. Use relaxation techniques to manage stress. Ask your health care provider to suggest some techniques that may work well for you. These may include: Breathing exercises. Routines to release muscle tension. Visualizing peaceful scenes. Make sure that you drive carefully. Avoid driving if you feel very sleepy. Keep all follow-up visits as told by your health care provider. This is important. Contact a health care provider if: You are tired throughout the day. You have trouble in your daily routine due to sleepiness. You continue to have sleep problems, or your sleep problems get worse. Get help right away if: You have serious thoughts about hurting yourself or someone else. If you ever feel like you may hurt yourself or others, or have thoughts about taking your own life, get help right away. You can go to your nearest emergency department or call: Your local emergency services (911 in the [place] A suicide crisis helpline, such as the National Suicide Prevention Lifeline at 480 496 4605 or 988 in  the [place] This is open 24 hours a day. Summary Insomnia is a sleep disorder that makes it difficult to fall asleep or stay asleep. Insomnia can be long-term (chronic) or short-term (acute). Treatment for insomnia depends on the cause. Treatment may focus on treating an underlying condition that is causing insomnia. Keep a sleep diary to help you and your health care provider figure out what could be causing your insomnia. This information is not intended to replace advice given to you by your health care provider. Make sure you discuss any questions you have with your health care provider. Document Revised: 10/06/2020 Document Reviewed: 01/22/2020 Elsevier Patient Education  2022 [employer].

## 2021-04-14 NOTE — Progress Notes (Signed)
Subjective:  Patient ID: Dustin Logan, male    DOB: 1965-08-15  Age: 56 y.o. MRN: [id number]  CC:  Chief Complaint  Patient presents with   Prediabetes    Pt here for recheck no concerns   Hyperlipidemia    Here for recheck no concerns    Hypertension    Here for recheck denies physical sxs    HPI Kru L Dustin Logan presents for   Prediabetes: Weight down 4 pounds from August.  No current meds. Watching diet, more water and cut back some on alcohol.   Physically active job. Sweet tea/soda/fast food: cut back on soda/sweet tea, more water. Eating more at house - min fast food.  Lab Results  Component Value Date   HGBA1C 5.9 10/22/2020   Wt Readings from Last 3 Encounters:  04/14/21 226 lb 9.6 oz (102.8 kg)  11/10/20 230 lb 3.2 oz (104.4 kg)  10/22/20 231 lb 12.8 oz (105.1 kg)   Hypertension: Amlodipine 5 mg daily.no new side effects. Out of meds past 5 days and intermittent dosing prior - told that not due for refill - not due until 26th?  Home readings:none.  BP Readings from Last 3 Encounters:  04/14/21 132/80  11/10/20 132/78  10/22/20 130/90   Lab Results  Component Value Date   CREATININE 0.87 10/22/2020   Gout: Last flare: none Daily meds: Allopurinol 200 mg daily. Prn med: none  needed. Has indomethacin.  Lab Results  Component Value Date   LABURIC 5.4 02/05/2020   Insomnia Has taken muscle relaxer to help get to sleep at times. Mind is on work. Sometimes wakes up at night - once per night.  No specific muscle aches. Some snoring.  No otc meds recently. Tried melatonin - no relief. No hx of OSA.  Rare caffeinated drinks in afternoon.  Depression screen [facility] 2/9 04/14/2021 11/10/2020 10/22/2020 02/05/2020 08/04/2019  Decreased Interest 0 0 0 0 0  Down, Depressed, Hopeless 0 0 0 0 0  PHQ - 2 Score 0 0 0 0 0  Altered sleeping - - 0 - -  Tired, decreased energy - - 0 - -  Change in appetite - - 0 - -  Feeling bad or failure about yourself  - - 0 - -   Trouble concentrating - - 0 - -  Moving slowly or fidgety/restless - - 0 - -  Suicidal thoughts - - 0 - -  PHQ-9 Score - - 0 - -  Difficult doing work/chores - - Not difficult at all - -      Hyperlipidemia: No current meds.  Plan for diet approach.  Was cutting back on alcohol when discussed in August.  Slight elevated LFT noted prior. Cut back but still some alcohol. 8 drinks per week. Lab Results  Component Value Date   CHOL 213 (H) 11/10/2020   HDL 58.90 11/10/2020   LDLCALC 115 (H) 11/10/2020   LDLDIRECT 116.0 10/22/2020   TRIG 196.0 (H) 11/10/2020   CHOLHDL 4 11/10/2020   Lab Results  Component Value Date   ALT 53 10/22/2020   AST 72 (H) 10/22/2020   ALKPHOS 82 10/22/2020   BILITOT 0.6 10/22/2020      History Patient Active Problem List   Diagnosis Date Noted   Essential hypertension, benign 03/29/2018   Tourette's syndrome 06/01/2015   Gout 10/03/2013   Past Medical History:  Diagnosis Date   Gout attack    Past Surgical History:  Procedure Laterality Date   HERNIA REPAIR  No Known Allergies Prior to Admission medications   Medication Sig Start Date End Date Taking? Authorizing Provider  allopurinol (ZYLOPRIM) 100 MG tablet TAKE 2 TABLETS BY MOUTH EVERY DAY 01/20/21  Yes Shade Flood, MD  amLODipine (NORVASC) 5 MG tablet TAKE 1 TABLET (5 MG TOTAL) BY MOUTH DAILY. 04/04/21  Yes Shade Flood, MD  cyclobenzaprine (FLEXERIL) 10 MG tablet Take 1 tablet (10 mg total) by mouth at bedtime. Needs office visit 10/22/20  Yes Shade Flood, MD  indomethacin (INDOCIN) 50 MG capsule TAKE 1 CAPSULE BY MOUTH TWICE A DAY WITH A MEAL AS NEEDED FOR GOUT 03/13/21  Yes Shade Flood, MD  meloxicam (MOBIC) 7.5 MG tablet TAKE 1 TABLET BY MOUTH EVERY DAY Patient not taking: Reported on 04/14/2021 11/22/20   Shade Flood, MD   Social History   Socioeconomic History   Marital status: Married    Spouse name: Not on file   Number of children: Not on file    Years of education: Not on file   Highest education level: Not on file  Occupational History   Not on file  Tobacco Use   Smoking status: Some Days    Types: Cigars   Smokeless tobacco: Never   Tobacco comments:    smokes 10 cigars in a week  Substance and Sexual Activity   Alcohol use: Yes    Alcohol/week: 12.0 standard drinks    Types: 12 Standard drinks or equivalent per week   Drug use: No   Sexual activity: Not on file  Other Topics Concern   Not on file  Social History Narrative   Not on file   Social Determinants of Health   Financial Resource Strain: Not on file  Food Insecurity: Not on file  Transportation Needs: Not on file  Physical Activity: Not on file  Stress: Not on file  Social Connections: Not on file  Intimate Partner Violence: Not on file    Review of Systems  Constitutional:  Negative for fatigue and unexpected weight change.  Eyes:  Negative for visual disturbance.  Respiratory:  Negative for cough, chest tightness and shortness of breath.   Cardiovascular:  Negative for chest pain, palpitations and leg swelling.  Gastrointestinal:  Negative for abdominal pain and blood in stool.  Neurological:  Negative for dizziness, light-headedness and headaches.    Objective:   Vitals:   04/14/21 0850  BP: 132/80  Pulse: 60  Resp: 16  Temp: 98 F (36.7 C)  TempSrc: Temporal  SpO2: 97%  Weight: 226 lb 9.6 oz (102.8 kg)  Height: 5\' 10"  (1.778 m)     Physical Exam Vitals reviewed.  Constitutional:      Appearance: He is well-developed.  HENT:     Head: Normocephalic and atraumatic.  Neck:     Vascular: No carotid bruit or JVD.  Cardiovascular:     Rate and Rhythm: Normal rate and regular rhythm.     Heart sounds: Normal heart sounds. No murmur heard. Pulmonary:     Effort: Pulmonary effort is normal.     Breath sounds: Normal breath sounds. No rales.  Musculoskeletal:     Right lower leg: No edema.     Left lower leg: No edema.   Skin:    General: Skin is warm and dry.  Neurological:     Mental Status: He is alert and oriented to person, place, and time.  Psychiatric:        Mood and Affect: Mood normal.  Behavior: Behavior normal.        Thought Content: Thought content normal.       Assessment & Plan:  [place] PedSonny L Dustin Logan is a 56 y.o. male . Prediabetes - Plan: Comprehensive metabolic panel, Hemoglobin A1c  -Commended on weight loss.  Check A1c.  Continue to watch diet, exercise and cut back on alcohol as able.  Commended on his decreased use.  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  Essential hypertension - Plan: amLODipine (NORVASC) 5 MG tablet, Comprehensive metabolic panel  -Borderline elevated in office but has been off medications as above.  Restart amlodipine 5 mg daily and will verify with pharmacy to make sure it can be filled.  1753-month follow-up.  Check labs  Hyperlipidemia, unspecified hyperlipidemia type - Plan: Comprehensive metabolic panel, Lipid panel  -No current meds, anticipate improvement with weight loss.  Check labs  Insomnia, unspecified type - Plan: hydrOXYzine (ATARAX) 10 MG tablet  -new concern. Likely psychological component, less likely OSA but some snoring.  Discussed trying hydroxyzine in place of Flexeril as no specific arthralgia/spasm.  Lowest effective dose, potential side effects discussed.  Handout on sleep hygiene.  Avoidance of caffeine late in the day, making lists and other techniques to help with workplace stress.  Recheck if not improving, otherwise 7453-month follow-up.  Needs flu shot - Plan: Flu Vaccine QUAD 6+ mos PF IM (Fluarix Quad PF)  LFT elevation  -Decreased alcohol use discussed previously, has cut back.  Repeat labs.  Meds ordered this encounter  Medications   amLODipine (NORVASC) 5 MG tablet    Sig: Take 1 tablet (5 mg total) by mouth daily.    Dispense:  90 tablet    Refill:  1    DX Code  Needed  .   hydrOXYzine (ATARAX) 10 MG tablet    Sig: Take 1-2 tablets (10-20 mg total) by mouth at bedtime as needed.    Dispense:  30 tablet    Refill:  0   Patient Instructions  See info on sleep below, make a list to lessen thoughts of work late at night. try hydroxyzine if needed to relax or get to sleep at night. Do not combine with alcohol. Follow up if you continue to have nighttime wakening as may need to look into other causes like sleep apnea.  Keep up the good work watching diet, and staying active.  Weight has improved.  I will check your lab work and expected to be better.  Continue to minimize alcohol use as possible, especially if the liver tests remain elevated.  Follow-up with me in 6 months unless persistent difficulty with sleep or persistent need for the sleep medication, then follow-up sooner and we can discuss other options if needed.  Thanks for coming in today.   Insomnia Insomnia is a sleep disorder that makes it difficult to fall asleep or stay asleep. Insomnia can cause fatigue, low energy, difficulty concentrating, mood swings, and poor performance at work or school. There are three different ways to classify insomnia: Difficulty falling asleep. Difficulty staying asleep. Waking up too early in the morning. Any type of insomnia can be long-term (chronic) or short-term (acute). Both are common. Short-term insomnia usually lasts for three months or less. Chronic insomnia occurs at least three times a week for longer than three months. What are the causes? Insomnia may be caused by another condition, situation, or substance, such as: Anxiety. Certain medicines. Gastroesophageal reflux disease (GERD) or  other gastrointestinal conditions. Asthma or other breathing conditions. Restless legs syndrome, sleep apnea, or other sleep disorders. Chronic pain. Menopause. Stroke. Abuse of alcohol, tobacco, or illegal drugs. Mental health conditions, such as  depression. Caffeine. Neurological disorders, such as Alzheimer's disease. An overactive thyroid (hyperthyroidism). Sometimes, the cause of insomnia may not be known. What increases the risk? Risk factors for insomnia include: Gender. Women are affected more often than men. Age. Insomnia is more common as you get older. Stress. Lack of exercise. Irregular work schedule or working night shifts. Traveling between different time zones. Certain medical and mental health conditions. What are the signs or symptoms? If you have insomnia, the main symptom is having trouble falling asleep or having trouble staying asleep. This may lead to other symptoms, such as: Feeling fatigued or having low energy. Feeling nervous about going to sleep. Not feeling rested in the morning. Having trouble concentrating. Feeling irritable, anxious, or depressed. How is this diagnosed? This condition may be diagnosed based on: Your symptoms and medical history. Your health care provider may ask about: Your sleep habits. Any medical conditions you have. Your mental health. A physical exam. How is this treated? Treatment for insomnia depends on the cause. Treatment may focus on treating an underlying condition that is causing insomnia. Treatment may also include: Medicines to help you sleep. Counseling or therapy. Lifestyle adjustments to help you sleep better. Follow these instructions at home: Eating and drinking  Limit or avoid alcohol, caffeinated beverages, and cigarettes, especially close to bedtime. These can disrupt your sleep. Do not eat a large meal or eat spicy foods right before bedtime. This can lead to digestive discomfort that can make it hard for you to sleep. Sleep habits  Keep a sleep diary to help you and your health care provider figure out what could be causing your insomnia. Write down: When you sleep. When you wake up during the night. How well you sleep. How rested you feel the  next day. Any side effects of medicines you are taking. What you eat and drink. Make your bedroom a dark, comfortable place where it is easy to fall asleep. Put up shades or blackout curtains to block light from outside. Use a white noise machine to block noise. Keep the temperature cool. Limit screen use before bedtime. This includes: Watching TV. Using your smartphone, tablet, or computer. Stick to a routine that includes going to bed and waking up at the same times every day and night. This can help you fall asleep faster. Consider making a quiet activity, such as reading, part of your nighttime routine. Try to avoid taking naps during the day so that you sleep better at night. Get out of bed if you are still awake after 15 minutes of trying to sleep. Keep the lights down, but try reading or doing a quiet activity. When you feel sleepy, go back to bed. General instructions Take over-the-counter and prescription medicines only as told by your health care provider. Exercise regularly, as told by your health care provider. Avoid exercise starting several hours before bedtime. Use relaxation techniques to manage stress. Ask your health care provider to suggest some techniques that may work well for you. These may include: Breathing exercises. Routines to release muscle tension. Visualizing peaceful scenes. Make sure that you drive carefully. Avoid driving if you feel very sleepy. Keep all follow-up visits as told by your health care provider. This is important. Contact a health care provider if: You are tired throughout the day.  You have trouble in your daily routine due to sleepiness. You continue to have sleep problems, or your sleep problems get worse. Get help right away if: You have serious thoughts about hurting yourself or someone else. If you ever feel like you may hurt yourself or others, or have thoughts about taking your own life, get help right away. You can go to your nearest  emergency department or call: Your local emergency services (911 in the [place] A suicide crisis helpline, such as the National Suicide Prevention Lifeline at (254)308-34841-(630)793-8831 or 988 in the [place] This is open 24 hours a day. Summary Insomnia is a sleep disorder that makes it difficult to fall asleep or stay asleep. Insomnia can be long-term (chronic) or short-term (acute). Treatment for insomnia depends on the cause. Treatment may focus on treating an underlying condition that is causing insomnia. Keep a sleep diary to help you and your health care provider figure out what could be causing your insomnia. This information is not intended to replace advice given to you by your health care provider. Make sure you discuss any questions you have with your health care provider. Document Revised: 10/06/2020 Document Reviewed: 01/22/2020 [employer] Patient Education  2022 [employer]         Signed,   Meredith StaggersJeffrey Antoria Lanza, MD [facility], [facility] 04/14/21 9:23 AM

## 2021-04-25 ENCOUNTER — Other Ambulatory Visit: Payer: Self-pay | Admitting: Family Medicine

## 2021-04-25 DIAGNOSIS — G4700 Insomnia, unspecified: Secondary | ICD-10-CM

## 2021-04-26 ENCOUNTER — Other Ambulatory Visit: Payer: Self-pay | Admitting: Family Medicine

## 2021-04-26 ENCOUNTER — Other Ambulatory Visit: Payer: Self-pay

## 2021-04-26 DIAGNOSIS — M1A09X0 Idiopathic chronic gout, multiple sites, without tophus (tophi): Secondary | ICD-10-CM

## 2021-07-31 ENCOUNTER — Other Ambulatory Visit: Payer: Self-pay | Admitting: Family Medicine

## 2021-07-31 DIAGNOSIS — M1A09X0 Idiopathic chronic gout, multiple sites, without tophus (tophi): Secondary | ICD-10-CM

## 2021-10-13 ENCOUNTER — Encounter: Payer: Self-pay | Admitting: Family Medicine

## 2021-10-13 ENCOUNTER — Ambulatory Visit: Payer: BC Managed Care – PPO | Admitting: Family Medicine

## 2021-10-13 VITALS — BP 132/70 | HR 70 | Temp 97.9°F | Resp 19 | Ht 70.0 in | Wt 218.8 lb

## 2021-10-13 DIAGNOSIS — R7303 Prediabetes: Secondary | ICD-10-CM

## 2021-10-13 DIAGNOSIS — R202 Paresthesia of skin: Secondary | ICD-10-CM

## 2021-10-13 DIAGNOSIS — M1A09X0 Idiopathic chronic gout, multiple sites, without tophus (tophi): Secondary | ICD-10-CM | POA: Diagnosis not present

## 2021-10-13 DIAGNOSIS — R200 Anesthesia of skin: Secondary | ICD-10-CM

## 2021-10-13 DIAGNOSIS — I10 Essential (primary) hypertension: Secondary | ICD-10-CM | POA: Diagnosis not present

## 2021-10-13 DIAGNOSIS — G4700 Insomnia, unspecified: Secondary | ICD-10-CM

## 2021-10-13 DIAGNOSIS — E785 Hyperlipidemia, unspecified: Secondary | ICD-10-CM | POA: Diagnosis not present

## 2021-10-13 LAB — COMPREHENSIVE METABOLIC PANEL
ALT: 48 U/L (ref 0–53)
AST: 66 U/L — ABNORMAL HIGH (ref 0–37)
Albumin: 4.6 g/dL (ref 3.5–5.2)
Alkaline Phosphatase: 60 U/L (ref 39–117)
BUN: 12 mg/dL (ref 6–23)
CO2: 25 mEq/L (ref 19–32)
Calcium: 9.5 mg/dL (ref 8.4–10.5)
Chloride: 106 mEq/L (ref 96–112)
Creatinine, Ser: 0.9 mg/dL (ref 0.40–1.50)
GFR: 96.51 mL/min (ref >60.00)
Glucose, Bld: 93 mg/dL (ref 70–99)
Potassium: 3.4 mEq/L — ABNORMAL LOW (ref 3.5–5.1)
Sodium: 141 mEq/L (ref 135–145)
Total Bilirubin: 0.6 mg/dL (ref 0.2–1.2)
Total Protein: 7.6 g/dL (ref 6.0–8.3)

## 2021-10-13 LAB — LIPID PANEL
Cholesterol: 220 mg/dL — ABNORMAL HIGH (ref 0–200)
HDL: 57.6 mg/dL (ref >39.00)
Total CHOL/HDL Ratio: 4
Triglycerides: 435 mg/dL — ABNORMAL HIGH (ref 0.0–149.0)

## 2021-10-13 LAB — HEMOGLOBIN A1C: Hgb A1c MFr Bld: 5.6 % (ref 4.6–6.5)

## 2021-10-13 LAB — LDL CHOLESTEROL, DIRECT: Direct LDL: 114 mg/dL

## 2021-10-13 MED ORDER — AMLODIPINE BESYLATE 5 MG PO TABS: 5.0000 mg | ORAL_TABLET | Freq: Every day | ORAL | 1 refills | Status: DC

## 2021-10-13 MED ORDER — ALLOPURINOL 100 MG PO TABS: 200.0000 mg | ORAL_TABLET | Freq: Every day | ORAL | 1 refills | Status: DC

## 2021-10-13 NOTE — Progress Notes (Signed)
Subjective:  Patient ID: Dustin Logan, male    DOB: 11-11-1965  Age: 56 y.o. MRN: [id number]  CC:  Chief Complaint  Patient presents with   Insomnia    Pt has not been taking hydrozine due to drowsy effect morning after   Prediabetes   Hypertension   Gout    HPI Dustin Logan presents for   Insomnia Discussed in January.  No relief with melatonin previously, has used muscle relaxer in the past at times.  Some difficulty getting to sleep due to mind being at work.  No history of OSA.  Trial of hydroxyzine but groggy next morning.   Still some difficulty at times getting to sleep. Wakes up at night few times -drink fluids before bed and during night. some snoring at times, not nightly. Denies daytime somnolence.   L arm and R leg tingling: Past year, off and on. No pain, no weakness, tingling only like it is asleep, then resolves in 1 minute. No weakness.  No neck pain, no back pain.  Tingling in R leg with walking on hard surfaces. Wears belt, symptoms on top/outside of thigh. Unknown trigger for arm.  Only occurs at times, sometimes not for a few weeks.   Gout: Last flare:no recent flare.  Daily meds: Allopurinol 200 mg daily. No new side effects.  Prn med: Has indomethacin if needed. Lab Results  Component Value Date   LABURIC 5.4 02/05/2020   Hypertension: Amlodipine 5 mg daily.no med side effects.  Home readings:none. BP Readings from Last 3 Encounters:  10/13/21 132/70  04/14/21 132/80  11/10/20 132/78   Lab Results  Component Value Date   CREATININE 0.95 04/14/2021   Prediabetes: Weight has continued to improve.  He was losing weight at his January visit as well with watching diet, more water and cutting back on alcohol.  He does have a physically active job.has avoided sweets and less sugar beverages. More water.  Lab Results  Component Value Date   HGBA1C 5.8 04/14/2021   Wt Readings from Last 3 Encounters:  10/13/21 218 lb 12.8 oz (99.2 kg)   04/14/21 226 lb 9.6 oz (102.8 kg)  11/10/20 230 lb 3.2 oz (104.4 kg)       History Patient Active Problem List   Diagnosis Date Noted   Essential hypertension, benign 03/29/2018   Tourette's syndrome 06/01/2015   Gout 10/03/2013   Past Medical History:  Diagnosis Date   Gout attack    Past Surgical History:  Procedure Laterality Date   HERNIA REPAIR     No Known Allergies Prior to Admission medications   Medication Sig Start Date End Date Taking? Authorizing Provider  allopurinol (ZYLOPRIM) 100 MG tablet Take 2 tablets (200 mg total) by mouth daily. 04/14/21  Yes Shade Flood, MD  amLODipine (NORVASC) 5 MG tablet Take 1 tablet (5 mg total) by mouth daily. 04/14/21  Yes Shade Flood, MD  indomethacin (INDOCIN) 50 MG capsule TAKE 1 CAPSULE BY MOUTH TWICE A DAY WITH A MEAL AS NEEDED FOR GOUT 08/01/21  Yes Shade Flood, MD  hydrOXYzine (ATARAX) 10 MG tablet Take 1-2 tablets (10-20 mg total) by mouth at bedtime as needed. Patient not taking: Reported on 10/13/2021 04/14/21   Shade Flood, MD   Social History   Socioeconomic History   Marital status: Married    Spouse name: Not on file   Number of children: Not on file   Years of education: Not on file  Highest education level: Not on file  Occupational History   Not on file  Tobacco Use   Smoking status: Some Days    Types: Cigars   Smokeless tobacco: Never   Tobacco comments:    smokes 10 cigars in a week  Substance and Sexual Activity   Alcohol use: Yes    Alcohol/week: 12.0 standard drinks of alcohol    Types: 12 Standard drinks or equivalent per week   Drug use: No   Sexual activity: Not on file  Other Topics Concern   Not on file  Social History Narrative   Not on file   Social Determinants of Health   Financial Resource Strain: Not on file  Food Insecurity: Not on file  Transportation Needs: Not on file  Physical Activity: Not on file  Stress: Not on file  Social Connections: Not on  file  Intimate Partner Violence: Not on file    Review of Systems  Constitutional:  Negative for fatigue and unexpected weight change.  Eyes:  Negative for visual disturbance.  Respiratory:  Negative for cough, chest tightness and shortness of breath.   Cardiovascular:  Negative for chest pain, palpitations and leg swelling.  Gastrointestinal:  Negative for abdominal pain and blood in stool.  Neurological:  Negative for dizziness, light-headedness and headaches.     Objective:   Vitals:   10/13/21 0841  BP: 132/70  Pulse: 70  Resp: 19  Temp: 97.9 F (36.6 C)  TempSrc: Oral  SpO2: 97%  Weight: 218 lb 12.8 oz (99.2 kg)  Height: 5\' 10"  (1.778 m)     Physical Exam Vitals reviewed.  Constitutional:      Appearance: He is well-developed.  HENT:     Head: Normocephalic and atraumatic.  Neck:     Vascular: No carotid bruit or JVD.  Cardiovascular:     Rate and Rhythm: Normal rate and regular rhythm.     Heart sounds: Normal heart sounds. No murmur heard. Pulmonary:     Effort: Pulmonary effort is normal.     Breath sounds: Normal breath sounds. No rales.  Musculoskeletal:     Right lower leg: No edema.     Left lower leg: No edema.     Comments: C-spine, decreased extension, slight decreased rotation, lateral flexion.  No midline bony tenderness, no pain with exam. Upper extremities without weakness.  Asymptomatic presently.  Lumbar spine nontender, pain-free range of motion.  Negative seated straight leg raise.  Equal strength lower extremities bilaterally.  Describes area of dysesthesia at the lateral thigh, asymptomatic currently.  Skin:    General: Skin is warm and dry.  Neurological:     Mental Status: He is alert and oriented to person, place, and time.  Psychiatric:        Mood and Affect: Mood normal.     Assessment & Plan:  Dustin Logan is a 56 y.o. male . Prediabetes - Plan: Hemoglobin A1c -Commended on improved weight and diet.  Check updated  A1c  Idiopathic chronic gout of multiple sites without tophus - Plan: allopurinol (ZYLOPRIM) 100 MG tablet  -No recent flare, continue allopurinol.  Essential hypertension - Plan: amLODipine (NORVASC) 5 MG tablet, Comprehensive metabolic panel  -Stable on current regimen, check labs.  Continue amlodipine same dose.  Insomnia, unspecified type  -Possibly multifactorial.  Avoid fluids just before bedtime to see if that will lessen nocturia and frequent wakening.  Handout given on insomnia and sleep hygiene.  If persistent snoring recommend follow-up with  sleep specialist.  If persistent insomnia return to clinic to discuss further as did not tolerate hydroxyzine, melatonin.  Hyperlipidemia, unspecified hyperlipidemia type - Plan: Lipid panel  - Check labs.  Determine medication need or changes based on results.  Numbness and tingling in left arm Numbness and tingling of right leg  -Possible meralgia paresthetica for leg symptoms, avoid constrictive clothing, belts, RTC precautions.  May also be due to some degenerative disease of spine both neck and back with left arm symptoms, right leg symptoms but without pain, weakness or persistent symptoms we will hold on imaging for now.  RTC precautions if more persistent or worsening.  Meds ordered this encounter  Medications   allopurinol (ZYLOPRIM) 100 MG tablet    Sig: Take 2 tablets (200 mg total) by mouth daily.    Dispense:  180 tablet    Refill:  1   amLODipine (NORVASC) 5 MG tablet    Sig: Take 1 tablet (5 mg total) by mouth daily.    Dispense:  90 tablet    Refill:  1    DX Code Needed  .   Patient Instructions  For sleep - see info below on sleep hygiene. If snoring is frequent and continued wakening, I recommend meeting with sleep specialist. Let me know. Try to avoid fluids within an hour of bedtime to lessen nighttime urination.  No med changes for now. I will check prediabetes test today.   Avoid tight belts or constrictive  clothing to see if that helps the leg symptoms. May be a pinched superficial nerve. You may have some arthritis in back and neck which may be causing these symptoms as well. If more persistent or associated with pain or weakness be seen for further testing.   Return to the clinic or go to the nearest emergency room if any of your symptoms worsen or new symptoms occur.   Insomnia Insomnia is a sleep disorder that makes it difficult to fall asleep or stay asleep. Insomnia can cause fatigue, low energy, difficulty concentrating, mood swings, and poor performance at work or school. There are three different ways to classify insomnia: Difficulty falling asleep. Difficulty staying asleep. Waking up too early in the morning. Any type of insomnia can be long-term (chronic) or short-term (acute). Both are common. Short-term insomnia usually lasts for 3 months or less. Chronic insomnia occurs at least three times a week for longer than 3 months. What are the causes? Insomnia may be caused by another condition, situation, or substance, such as: Having certain mental health conditions, such as anxiety and depression. Using caffeine, alcohol, tobacco, or drugs. Having gastrointestinal conditions, such as gastroesophageal reflux disease (GERD). Having certain medical conditions. These include: Asthma. Alzheimer's disease. Stroke. Chronic pain. An overactive thyroid gland (hyperthyroidism). Other sleep disorders, such as restless legs syndrome and sleep apnea. Menopause. Sometimes, the cause of insomnia may not be known. What increases the risk? Risk factors for insomnia include: Gender. Females are affected more often than males. Age. Insomnia is more common as people get older. Stress and certain medical and mental health conditions. Lack of exercise. Having an irregular work schedule. This may include working night shifts and traveling between different time zones. What are the signs or  symptoms? If you have insomnia, the main symptom is having trouble falling asleep or having trouble staying asleep. This may lead to other symptoms, such as: Feeling tired or having low energy. Feeling nervous about going to sleep. Not feeling rested in the morning. Having  trouble concentrating. Feeling irritable, anxious, or depressed. How is this diagnosed? This condition may be diagnosed based on: Your symptoms and medical history. Your health care provider may ask about: Your sleep habits. Any medical conditions you have. Your mental health. A physical exam. How is this treated? Treatment for insomnia depends on the cause. Treatment may focus on treating an underlying condition that is causing the insomnia. Treatment may also include: Medicines to help you sleep. Counseling or therapy. Lifestyle adjustments to help you sleep better. Follow these instructions at home: Eating and drinking  Limit or avoid alcohol, caffeinated beverages, and products that contain nicotine and tobacco, especially close to bedtime. These can disrupt your sleep. Do not eat a large meal or eat spicy foods right before bedtime. This can lead to digestive discomfort that can make it hard for you to sleep. Sleep habits  Keep a sleep diary to help you and your health care provider figure out what could be causing your insomnia. Write down: When you sleep. When you wake up during the night. How well you sleep and how rested you feel the next day. Any side effects of medicines you are taking. What you eat and drink. Make your bedroom a dark, comfortable place where it is easy to fall asleep. Put up shades or blackout curtains to block light from outside. Use a white noise machine to block noise. Keep the temperature cool. Limit screen use before bedtime. This includes: Not watching TV. Not using your smartphone, tablet, or computer. Stick to a routine that includes going to bed and waking up at the same  times every day and night. This can help you fall asleep faster. Consider making a quiet activity, such as reading, part of your nighttime routine. Try to avoid taking naps during the day so that you sleep better at night. Get out of bed if you are still awake after 15 minutes of trying to sleep. Keep the lights down, but try reading or doing a quiet activity. When you feel sleepy, go back to bed. General instructions Take over-the-counter and prescription medicines only as told by your health care provider. Exercise regularly as told by your health care provider. However, avoid exercising in the hours right before bedtime. Use relaxation techniques to manage stress. Ask your health care provider to suggest some techniques that may work well for you. These may include: Breathing exercises. Routines to release muscle tension. Visualizing peaceful scenes. Make sure that you drive carefully. Do not drive if you feel very sleepy. Keep all follow-up visits. This is important. Contact a health care provider if: You are tired throughout the day. You have trouble in your daily routine due to sleepiness. You continue to have sleep problems, or your sleep problems get worse. Get help right away if: You have thoughts about hurting yourself or someone else. Get help right away if you feel like you may hurt yourself or others, or have thoughts about taking your own life. Go to your nearest emergency room or: Call 911. Call the National Suicide Prevention Lifeline at (870)793-1711 or 988. This is open 24 hours a day. Text the Crisis Text Line at 409-640-9416. Summary Insomnia is a sleep disorder that makes it difficult to fall asleep or stay asleep. Insomnia can be long-term (chronic) or short-term (acute). Treatment for insomnia depends on the cause. Treatment may focus on treating an underlying condition that is causing the insomnia. Keep a sleep diary to help you and your health care provider figure  out  what could be causing your insomnia. This information is not intended to replace advice given to you by your health care provider. Make sure you discuss any questions you have with your health care provider. Document Revised: 02/21/2021 Document Reviewed: 02/21/2021 Elsevier Patient Education  2023 [employer]     Signed,   Meredith Staggers, MD [facility], [facility] 10/13/21 9:40 AM

## 2021-10-13 NOTE — Patient Instructions (Addendum)
For sleep - see info below on sleep hygiene. If snoring is frequent and continued wakening, I recommend meeting with sleep specialist. Let me know. Try to avoid fluids within an hour of bedtime to lessen nighttime urination.  No med changes for now. I will check prediabetes test today.   Avoid tight belts or constrictive clothing to see if that helps the leg symptoms. May be a pinched superficial nerve. You may have some arthritis in back and neck which may be causing these symptoms as well. If more persistent or associated with pain or weakness be seen for further testing.   Return to the clinic or go to the nearest emergency room if any of your symptoms worsen or new symptoms occur.   Insomnia Insomnia is a sleep disorder that makes it difficult to fall asleep or stay asleep. Insomnia can cause fatigue, low energy, difficulty concentrating, mood swings, and poor performance at work or school. There are three different ways to classify insomnia: Difficulty falling asleep. Difficulty staying asleep. Waking up too early in the morning. Any type of insomnia can be long-term (chronic) or short-term (acute). Both are common. Short-term insomnia usually lasts for 3 months or less. Chronic insomnia occurs at least three times a week for longer than 3 months. What are the causes? Insomnia may be caused by another condition, situation, or substance, such as: Having certain mental health conditions, such as anxiety and depression. Using caffeine, alcohol, tobacco, or drugs. Having gastrointestinal conditions, such as gastroesophageal reflux disease (GERD). Having certain medical conditions. These include: Asthma. Alzheimer's disease. Stroke. Chronic pain. An overactive thyroid gland (hyperthyroidism). Other sleep disorders, such as restless legs syndrome and sleep apnea. Menopause. Sometimes, the cause of insomnia may not be known. What increases the risk? Risk factors for insomnia  include: Gender. Females are affected more often than males. Age. Insomnia is more common as people get older. Stress and certain medical and mental health conditions. Lack of exercise. Having an irregular work schedule. This may include working night shifts and traveling between different time zones. What are the signs or symptoms? If you have insomnia, the main symptom is having trouble falling asleep or having trouble staying asleep. This may lead to other symptoms, such as: Feeling tired or having low energy. Feeling nervous about going to sleep. Not feeling rested in the morning. Having trouble concentrating. Feeling irritable, anxious, or depressed. How is this diagnosed? This condition may be diagnosed based on: Your symptoms and medical history. Your health care provider may ask about: Your sleep habits. Any medical conditions you have. Your mental health. A physical exam. How is this treated? Treatment for insomnia depends on the cause. Treatment may focus on treating an underlying condition that is causing the insomnia. Treatment may also include: Medicines to help you sleep. Counseling or therapy. Lifestyle adjustments to help you sleep better. Follow these instructions at home: Eating and drinking  Limit or avoid alcohol, caffeinated beverages, and products that contain nicotine and tobacco, especially close to bedtime. These can disrupt your sleep. Do not eat a large meal or eat spicy foods right before bedtime. This can lead to digestive discomfort that can make it hard for you to sleep. Sleep habits  Keep a sleep diary to help you and your health care provider figure out what could be causing your insomnia. Write down: When you sleep. When you wake up during the night. How well you sleep and how rested you feel the next day. Any side effects of  medicines you are taking. What you eat and drink. Make your bedroom a dark, comfortable place where it is easy to fall  asleep. Put up shades or blackout curtains to block light from outside. Use a white noise machine to block noise. Keep the temperature cool. Limit screen use before bedtime. This includes: Not watching TV. Not using your smartphone, tablet, or computer. Stick to a routine that includes going to bed and waking up at the same times every day and night. This can help you fall asleep faster. Consider making a quiet activity, such as reading, part of your nighttime routine. Try to avoid taking naps during the day so that you sleep better at night. Get out of bed if you are still awake after 15 minutes of trying to sleep. Keep the lights down, but try reading or doing a quiet activity. When you feel sleepy, go back to bed. General instructions Take over-the-counter and prescription medicines only as told by your health care provider. Exercise regularly as told by your health care provider. However, avoid exercising in the hours right before bedtime. Use relaxation techniques to manage stress. Ask your health care provider to suggest some techniques that may work well for you. These may include: Breathing exercises. Routines to release muscle tension. Visualizing peaceful scenes. Make sure that you drive carefully. Do not drive if you feel very sleepy. Keep all follow-up visits. This is important. Contact a health care provider if: You are tired throughout the day. You have trouble in your daily routine due to sleepiness. You continue to have sleep problems, or your sleep problems get worse. Get help right away if: You have thoughts about hurting yourself or someone else. Get help right away if you feel like you may hurt yourself or others, or have thoughts about taking your own life. Go to your nearest emergency room or: Call 911. Call the National Suicide Prevention Lifeline at 417 597 3067 or 988. This is open 24 hours a day. Text the Crisis Text Line at 469 330 7016. Summary Insomnia is a  sleep disorder that makes it difficult to fall asleep or stay asleep. Insomnia can be long-term (chronic) or short-term (acute). Treatment for insomnia depends on the cause. Treatment may focus on treating an underlying condition that is causing the insomnia. Keep a sleep diary to help you and your health care provider figure out what could be causing your insomnia. This information is not intended to replace advice given to you by your health care provider. Make sure you discuss any questions you have with your health care provider. Document Revised: 02/21/2021 Document Reviewed: 02/21/2021 Elsevier Patient Education  2023 [employer].

## 2021-11-05 ENCOUNTER — Other Ambulatory Visit: Payer: Self-pay | Admitting: Family Medicine

## 2021-11-05 DIAGNOSIS — M1A09X0 Idiopathic chronic gout, multiple sites, without tophus (tophi): Secondary | ICD-10-CM

## 2022-02-23 IMAGING — DX DG SHOULDER 2+V*R*
3 series · 3 of 3 positions shown · non-contrast
Comparison: None.

CLINICAL DATA: Pain x6 months

EXAM:
RIGHT SHOULDER - 2+ VIEW

[grashey]
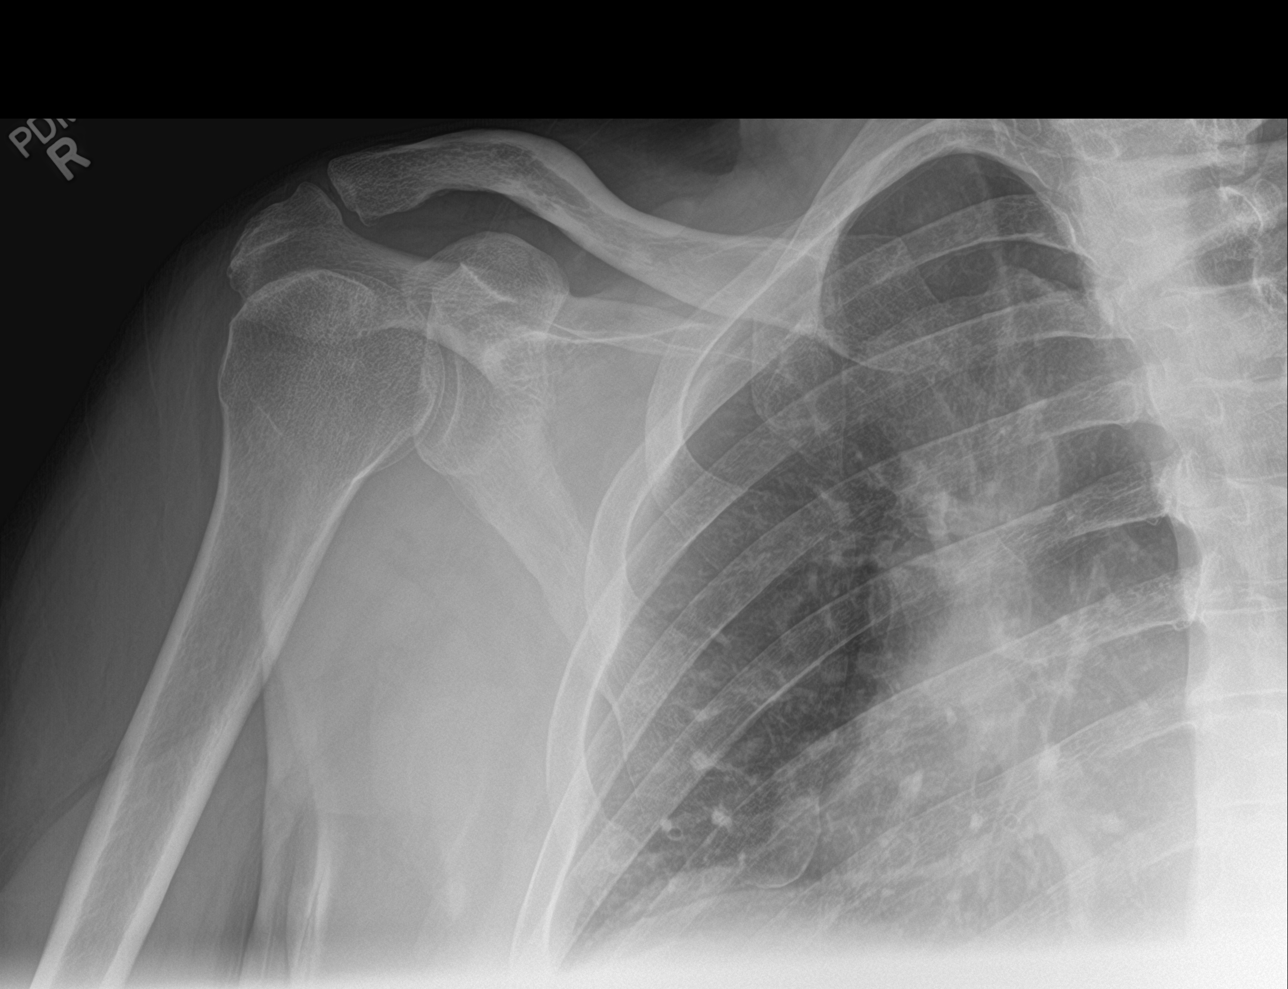

[y view]
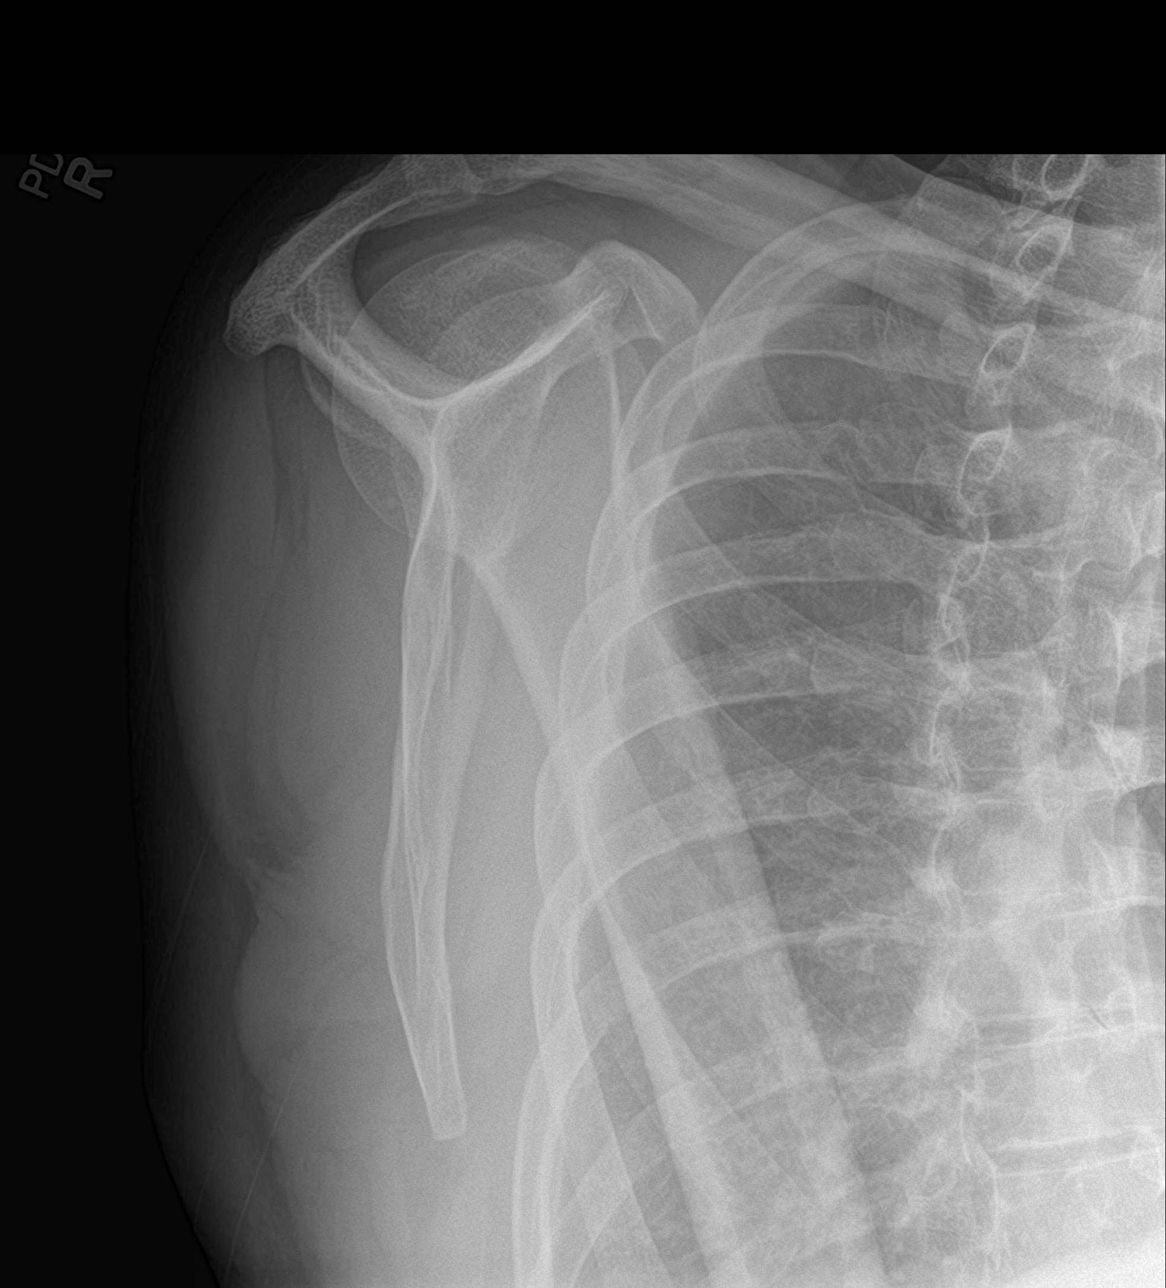

[shoulder axial]
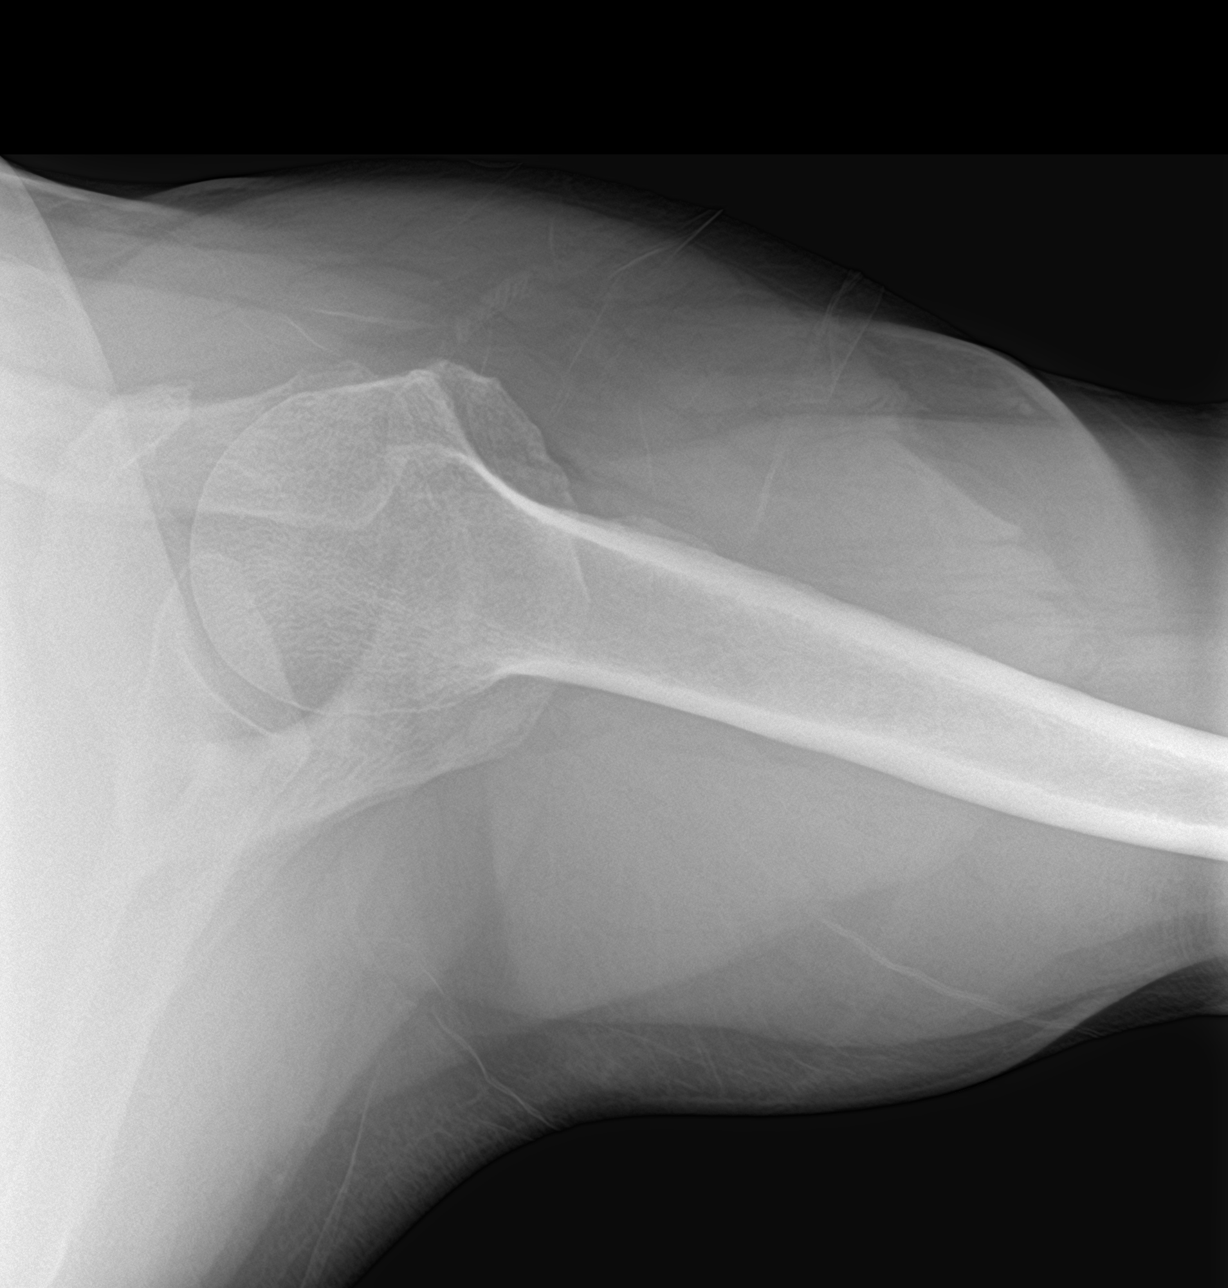

[3 of 3 positions shown; findings below may reference images not displayed]

FINDINGS: There is no evidence of fracture or dislocation. There is no
evidence of arthropathy or other focal bone abnormality. Soft
tissues are unremarkable.
IMPRESSION: Negative.

## 2022-04-11 ENCOUNTER — Other Ambulatory Visit: Payer: Self-pay | Admitting: Family Medicine

## 2022-04-11 DIAGNOSIS — M1A09X0 Idiopathic chronic gout, multiple sites, without tophus (tophi): Secondary | ICD-10-CM

## 2022-04-11 DIAGNOSIS — I10 Essential (primary) hypertension: Secondary | ICD-10-CM

## 2022-04-17 ENCOUNTER — Encounter: Payer: Self-pay | Admitting: Family Medicine

## 2022-04-17 ENCOUNTER — Ambulatory Visit (INDEPENDENT_AMBULATORY_CARE_PROVIDER_SITE_OTHER): Payer: BC Managed Care – PPO | Admitting: Family Medicine

## 2022-04-17 VITALS — BP 128/80 | HR 94 | Temp 97.6°F | Ht 70.5 in | Wt 225.6 lb

## 2022-04-17 DIAGNOSIS — I10 Essential (primary) hypertension: Secondary | ICD-10-CM

## 2022-04-17 DIAGNOSIS — E785 Hyperlipidemia, unspecified: Secondary | ICD-10-CM

## 2022-04-17 DIAGNOSIS — Z125 Encounter for screening for malignant neoplasm of prostate: Secondary | ICD-10-CM | POA: Diagnosis not present

## 2022-04-17 DIAGNOSIS — R7303 Prediabetes: Secondary | ICD-10-CM | POA: Diagnosis not present

## 2022-04-17 DIAGNOSIS — Z0000 Encounter for general adult medical examination without abnormal findings: Secondary | ICD-10-CM

## 2022-04-17 LAB — HEMOGLOBIN A1C: Hgb A1c MFr Bld: 5.9 % (ref 4.6–6.5)

## 2022-04-17 LAB — LIPID PANEL
Cholesterol: 234 mg/dL — ABNORMAL HIGH (ref 0–200)
HDL: 56.1 mg/dL (ref >39.00)
Total CHOL/HDL Ratio: 4
Triglycerides: 483 mg/dL — ABNORMAL HIGH (ref 0.0–149.0)

## 2022-04-17 LAB — PSA: PSA: 0.54 ng/mL (ref 0.10–4.00)

## 2022-04-17 LAB — LDL CHOLESTEROL, DIRECT: Direct LDL: 110 mg/dL

## 2022-04-17 NOTE — Progress Notes (Signed)
Subjective:  Patient ID: Dustin Logan, male    DOB: 01/16/1966  Age: 57 y.o. MRN: [id number]  CC:  Chief Complaint  Patient presents with   Annual Exam    Pt notes no concerns, pt is fasting     HPI Dustin Logan presents for Annual Exam  Hypertension: Amlodipine 5 mg daily. No new side effects.  No home readings BP Readings from Last 3 Encounters:  04/17/22 128/80  10/13/21 132/70  04/14/21 132/80   Lab Results  Component Value Date   CREATININE 0.90 10/13/2021   Gout: Last flare:none recent.  Daily meds: Allopurinol 200 mg daily, no side effects Prn med: Indomethacin Lab Results  Component Value Date   LABURIC 5.4 02/05/2020   Prediabetes: Borderline A1c in July. Less active with work - slower pace during the winter.  Walking for exercise, yardwork, staying busy. Weight up similar to last January.  Mostly water, but few sodas per day.   Lab Results  Component Value Date   HGBA1C 5.6 10/13/2021   Wt Readings from Last 3 Encounters:  04/17/22 225 lb 9.6 oz (102.3 kg)  10/13/21 218 lb 12.8 oz (99.2 kg)  04/14/21 226 lb 9.6 oz (102.8 kg)   Insomnia No relief with melatonin in the past.  Some grogginess the next morning after hydroxyzine.  Discussed last July.  Sleep hygiene discussed with handout given, and option to meet with sleep specialist.  Also recommended avoidance of fluids within an hour bedtime to lessen nocturia.  Sleep has been doing well. Avoiding fluids right before bedtime.  Occasional fleeting tingling sensation R arm, no pain, no neck pain or weakness. Plans neck ROM, heat and RTC if persists.      04/17/2022    8:44 AM 10/13/2021    8:45 AM 04/14/2021    8:54 AM 11/10/2020   11:43 AM 10/22/2020   10:28 AM  Depression screen PHQ 2/9  Decreased Interest 0 0 0 0 0  Down, Depressed, Hopeless 0 0 0 0 0  PHQ - 2 Score 0 0 0 0 0  Altered sleeping 0    0  Tired, decreased energy 0    0  Change in appetite 0    0  Feeling bad or failure about  yourself  0    0  Trouble concentrating 0    0  Moving slowly or fidgety/restless 0    0  Suicidal thoughts 0    0  PHQ-9 Score 0    0  Difficult doing work/chores     Not difficult at all    Health Maintenance  Topic Date Due   DTaP/Tdap/Td (1 - Tdap) Never done   INFLUENZA VACCINE  06/26/2022 (Originally 10/25/2021)   Zoster Vaccines- Shingrix (1 of 2) 07/17/2022 (Originally 10/24/2016)   COLONOSCOPY (Pts 45-41yrs Insurance coverage will need to be confirmed)  10/14/2022 (Originally 05/28/2019)   Hepatitis C Screening  10/14/2022 (Originally 10/24/1984)   HIV Screening  10/14/2022 (Originally 10/24/1981)   HPV VACCINES  Aged Out   COVID-19 Vaccine  Discontinued  Colonoscopy in past year? Dr. Benson [place]. Repeat in 5 years.  Prostate: does not have family history of prostate cancer The natural history of prostate cancer and ongoing controversy regarding screening and potential treatment outcomes of prostate cancer has been discussed with the patient. The meaning of a false positive PSA and a false negative PSA has been discussed. He indicates understanding of the limitations of this screening test and wishes  to proceed  with screening PSA testing. Lab Results  Component Value Date   PSA1 0.6 06/27/2018    Immunization History  Administered Date(s) Administered   Influenza,inj,Quad PF,6+ Mos 04/14/2021  Flu vaccine - declines.  Covid vaccine - declines.  Shingrix - declines Hep C, HIV - declines.  Declines STI screening.   No results found. Reader glasses - sees ok. No recetn optho eval.   Dental: dentures.   Alcohol: 12 pack per week.   Tobacco: cigars - few per week.   Exercise:walking yardwork, physical work as above.    History Patient Active Problem List   Diagnosis Date Noted   Essential hypertension, benign 03/29/2018   Tourette's syndrome 06/01/2015   Gout 10/03/2013   Past Medical History:  Diagnosis Date   Gout attack    Past Surgical History:  Procedure  Laterality Date   HERNIA REPAIR     No Known Allergies Prior to Admission medications   Medication Sig Start Date End Date Taking? Authorizing Provider  allopurinol (ZYLOPRIM) 100 MG tablet TAKE 2 TABLETS BY MOUTH EVERY DAY 04/11/22  Yes Shade Flood, MD  amLODipine (NORVASC) 5 MG tablet TAKE 1 TABLET (5 MG TOTAL) BY MOUTH DAILY. 04/11/22  Yes Shade Flood, MD  indomethacin (INDOCIN) 50 MG capsule TAKE 1 CAPSULE BY MOUTH TWICE A DAY WITH A MEAL AS NEEDED FOR GOUT 11/07/21  Yes Shade Flood, MD  hydrOXYzine (ATARAX) 10 MG tablet Take 1-2 tablets (10-20 mg total) by mouth at bedtime as needed. Patient not taking: Reported on 10/13/2021 04/14/21   Shade Flood, MD   Social History   Socioeconomic History   Marital status: Married    Spouse name: Not on file   Number of children: Not on file   Years of education: Not on file   Highest education level: Not on file  Occupational History   Not on file  Tobacco Use   Smoking status: Some Days    Types: Cigars   Smokeless tobacco: Never   Tobacco comments:    smokes 10 cigars in a week  Substance and Sexual Activity   Alcohol use: Yes    Alcohol/week: 12.0 standard drinks of alcohol    Types: 12 Standard drinks or equivalent per week   Drug use: Not Currently   Sexual activity: Yes  Other Topics Concern   Not on file  Social History Narrative   Not on file   Social Determinants of Health   Financial Resource Strain: Not on file  Food Insecurity: Not on file  Transportation Needs: Not on file  Physical Activity: Not on file  Stress: Not on file  Social Connections: Not on file  Intimate Partner Violence: Not on file    Review of Systems 13 point review of systems per patient health survey noted.  Negative other than as indicated above or in HPI.    Objective:   Vitals:   04/17/22 0846  BP: 128/80  Pulse: 94  Temp: 97.6 F (36.4 C)  TempSrc: Oral  SpO2: 97%  Weight: 225 lb 9.6 oz (102.3 kg)   Height: 5' 10.5" (1.791 m)     Physical Exam Vitals reviewed.  Constitutional:      Appearance: He is well-developed.  HENT:     Head: Normocephalic and atraumatic.     Right Ear: External ear normal.     Left Ear: External ear normal.  Eyes:     Conjunctiva/sclera: Conjunctivae normal.     Pupils: Pupils are equal,  round, and reactive to light.  Neck:     Thyroid: No thyromegaly.  Cardiovascular:     Rate and Rhythm: Normal rate and regular rhythm.     Heart sounds: Normal heart sounds.  Pulmonary:     Effort: Pulmonary effort is normal. No respiratory distress.     Breath sounds: Normal breath sounds. No wheezing.  Abdominal:     General: There is no distension.     Palpations: Abdomen is soft.     Tenderness: There is no abdominal tenderness.  Musculoskeletal:        General: No tenderness.     Cervical back: Normal range of motion and neck supple.     Comments: Neck, decreased extension, slight decreased rotation right greater than left but pain-free and does not reproduce arm symptoms.  Arm strength, grip strength equal bilaterally, arms nontender, no Seizures.  Unable to reproduce dysesthesias on exam  Lymphadenopathy:     Cervical: No cervical adenopathy.  Skin:    General: Skin is warm and dry.  Neurological:     Mental Status: He is alert and oriented to person, place, and time.     Deep Tendon Reflexes: Reflexes are normal and symmetric.  Psychiatric:        Behavior: Behavior normal.       Assessment & Plan:  DICKSON KOSTELNIK is a 57 y.o. male . Annual physical exam - Plan: COMPLETE METABOLIC PANEL WITH GFR, Lipid panel, Hemoglobin A1c, PSA  - -anticipatory guidance as below in AVS, screening labs above. Health maintenance items as above in HPI discussed/recommended as applicable.   Essential hypertension - Plan: COMPLETE METABOLIC PANEL WITH GFR  -  Stable, tolerating current regimen. Medications refilled. Labs pending as above.   Prediabetes - Plan:  Hemoglobin A1c  - borderline on last A1c. Weight up in winter. Decreased sodas discussed, check labs.   Hyperlipidemia, unspecified hyperlipidemia type - Plan: Lipid panel  - check labs, medication adjustment accordingly.   Screening for prostate cancer - Plan: PSA   No orders of the defined types were placed in this encounter.  Patient Instructions  I do recommend screening eye exam this year.  No med changes for now.  Take care!  Preventive Care 58-39 Years Old, Male Preventive care refers to lifestyle choices and visits with your health care provider that can promote health and wellness. Preventive care visits are also called wellness exams. What can I expect for my preventive care visit? Counseling During your preventive care visit, your health care provider may ask about your: Medical history, including: Past medical problems. Family medical history. Current health, including: Emotional well-being. Home life and relationship well-being. Sexual activity. Lifestyle, including: Alcohol, nicotine or tobacco, and drug use. Access to firearms. Diet, exercise, and sleep habits. Safety issues such as seatbelt and bike helmet use. Sunscreen use. Work and work Astronomer. Physical exam Your health care provider will check your: Height and weight. These may be used to calculate your BMI (body mass index). BMI is a measurement that tells if you are at a healthy weight. Waist circumference. This measures the distance around your waistline. This measurement also tells if you are at a healthy weight and may help predict your risk of certain diseases, such as type 2 diabetes and high blood pressure. Heart rate and blood pressure. Body temperature. Skin for abnormal spots. What immunizations do I need?  Vaccines are usually given at various ages, according to a schedule. Your health care provider will recommend  vaccines for you based on your age, medical history, and lifestyle or  other factors, such as travel or where you work. What tests do I need? Screening Your health care provider may recommend screening tests for certain conditions. This may include: Lipid and cholesterol levels. Diabetes screening. This is done by checking your blood sugar (glucose) after you have not eaten for a while (fasting). Hepatitis B test. Hepatitis C test. HIV (human immunodeficiency virus) test. STI (sexually transmitted infection) testing, if you are at risk. Lung cancer screening. Prostate cancer screening. Colorectal cancer screening. Talk with your health care provider about your test results, treatment options, and if necessary, the need for more tests. Follow these instructions at home: Eating and drinking  Eat a diet that includes fresh fruits and vegetables, whole grains, lean protein, and low-fat dairy products. Take vitamin and mineral supplements as recommended by your health care provider. Do not drink alcohol if your health care provider tells you not to drink. If you drink alcohol: Limit how much you have to 0-2 drinks a day. Know how much alcohol is in your drink. In the [place] one drink equals one 12 oz bottle of beer (355 mL), one 5 oz glass of wine (148 mL), or one 1 oz glass of hard liquor (44 mL). Lifestyle Brush your teeth every morning and night with fluoride toothpaste. Floss one time each day. Exercise for at least 30 minutes 5 or more days each week. Do not use any products that contain nicotine or tobacco. These products include cigarettes, chewing tobacco, and vaping devices, such as e-cigarettes. If you need help quitting, ask your health care provider. Do not use drugs. If you are sexually active, practice safe sex. Use a condom or other form of protection to prevent STIs. Take aspirin only as told by your health care provider. Make sure that you understand how much to take and what form to take. Work with your health care provider to find out whether  it is safe and beneficial for you to take aspirin daily. Find healthy ways to manage stress, such as: Meditation, yoga, or listening to music. Journaling. Talking to a trusted person. Spending time with friends and family. Minimize exposure to UV radiation to reduce your risk of skin cancer. Safety Always wear your seat belt while driving or riding in a vehicle. Do not drive: If you have been drinking alcohol. Do not ride with someone who has been drinking. When you are tired or distracted. While texting. If you have been using any mind-altering substances or drugs. Wear a helmet and other protective equipment during sports activities. If you have firearms in your house, make sure you follow all gun safety procedures. What's next? Go to your health care provider once a year for an annual wellness visit. Ask your health care provider how often you should have your eyes and teeth checked. Stay up to date on all vaccines. This information is not intended to replace advice given to you by your health care provider. Make sure you discuss any questions you have with your health care provider. Document Revised: 09/08/2020 Document Reviewed: 09/08/2020 Elsevier Patient Education  [place],   Merri Ray, MD [place] 04/17/22 9:20 AM

## 2022-04-17 NOTE — Patient Instructions (Signed)
I do recommend screening eye exam this year.  No med changes for now.  Take care!  Preventive Care 38-56 Years Old, Male Preventive care refers to lifestyle choices and visits with your health care provider that can promote health and wellness. Preventive care visits are also called wellness exams. What can I expect for my preventive care visit? Counseling During your preventive care visit, your health care provider may ask about your: Medical history, including: Past medical problems. Family medical history. Current health, including: Emotional well-being. Home life and relationship well-being. Sexual activity. Lifestyle, including: Alcohol, nicotine or tobacco, and drug use. Access to firearms. Diet, exercise, and sleep habits. Safety issues such as seatbelt and bike helmet use. Sunscreen use. Work and work Statistician. Physical exam Your health care provider will check your: Height and weight. These may be used to calculate your BMI (body mass index). BMI is a measurement that tells if you are at a healthy weight. Waist circumference. This measures the distance around your waistline. This measurement also tells if you are at a healthy weight and may help predict your risk of certain diseases, such as type 2 diabetes and high blood pressure. Heart rate and blood pressure. Body temperature. Skin for abnormal spots. What immunizations do I need?  Vaccines are usually given at various ages, according to a schedule. Your health care provider will recommend vaccines for you based on your age, medical history, and lifestyle or other factors, such as travel or where you work. What tests do I need? Screening Your health care provider may recommend screening tests for certain conditions. This may include: Lipid and cholesterol levels. Diabetes screening. This is done by checking your blood sugar (glucose) after you have not eaten for a while (fasting). Hepatitis B test. Hepatitis C  test. HIV (human immunodeficiency virus) test. STI (sexually transmitted infection) testing, if you are at risk. Lung cancer screening. Prostate cancer screening. Colorectal cancer screening. Talk with your health care provider about your test results, treatment options, and if necessary, the need for more tests. Follow these instructions at home: Eating and drinking  Eat a diet that includes fresh fruits and vegetables, whole grains, lean protein, and low-fat dairy products. Take vitamin and mineral supplements as recommended by your health care provider. Do not drink alcohol if your health care provider tells you not to drink. If you drink alcohol: Limit how much you have to 0-2 drinks a day. Know how much alcohol is in your drink. In the [place] one drink equals one 12 oz bottle of beer (355 mL), one 5 oz glass of wine (148 mL), or one 1 oz glass of hard liquor (44 mL). Lifestyle Brush your teeth every morning and night with fluoride toothpaste. Floss one time each day. Exercise for at least 30 minutes 5 or more days each week. Do not use any products that contain nicotine or tobacco. These products include cigarettes, chewing tobacco, and vaping devices, such as e-cigarettes. If you need help quitting, ask your health care provider. Do not use drugs. If you are sexually active, practice safe sex. Use a condom or other form of protection to prevent STIs. Take aspirin only as told by your health care provider. Make sure that you understand how much to take and what form to take. Work with your health care provider to find out whether it is safe and beneficial for you to take aspirin daily. Find healthy ways to manage stress, such as: Meditation, yoga, or listening to music. Journaling.  Talking to a trusted person. Spending time with friends and family. Minimize exposure to UV radiation to reduce your risk of skin cancer. Safety Always wear your seat belt while driving or riding in a  vehicle. Do not drive: If you have been drinking alcohol. Do not ride with someone who has been drinking. When you are tired or distracted. While texting. If you have been using any mind-altering substances or drugs. Wear a helmet and other protective equipment during sports activities. If you have firearms in your house, make sure you follow all gun safety procedures. What's next? Go to your health care provider once a year for an annual wellness visit. Ask your health care provider how often you should have your eyes and teeth checked. Stay up to date on all vaccines. This information is not intended to replace advice given to you by your health care provider. Make sure you discuss any questions you have with your health care provider. Document Revised: 09/08/2020 Document Reviewed: 09/08/2020 Elsevier Patient Education  [place].

## 2022-04-18 LAB — COMPLETE METABOLIC PANEL WITH GFR
AG Ratio: 1.6 (calc) (ref 1.0–2.5)
ALT: 35 U/L (ref 9–46)
AST: 31 U/L (ref 10–35)
Albumin: 4.5 g/dL (ref 3.6–5.1)
Alkaline phosphatase (APISO): 71 U/L (ref 35–144)
BUN: 11 mg/dL (ref 7–25)
CO2: 27 mmol/L (ref 20–32)
Calcium: 9.7 mg/dL (ref 8.6–10.3)
Chloride: 106 mmol/L (ref 98–110)
Creat: 1 mg/dL (ref 0.70–1.30)
Globulin: 2.9 g/dL (calc) (ref 1.9–3.7)
Glucose, Bld: 106 mg/dL — ABNORMAL HIGH (ref 65–99)
Potassium: 4.7 mmol/L (ref 3.5–5.3)
Sodium: 141 mmol/L (ref 135–146)
Total Bilirubin: 0.6 mg/dL (ref 0.2–1.2)
Total Protein: 7.4 g/dL (ref 6.1–8.1)
eGFR: 89 mL/min/{1.73_m2} (ref >=60)

## 2022-10-16 ENCOUNTER — Ambulatory Visit: Payer: No Typology Code available for payment source | Admitting: Family Medicine

## 2022-10-16 ENCOUNTER — Encounter: Payer: Self-pay | Admitting: Family Medicine

## 2022-10-16 ENCOUNTER — Other Ambulatory Visit: Payer: Self-pay | Admitting: Family Medicine

## 2022-10-16 VITALS — BP 130/80 | HR 87 | Temp 97.9°F | Ht 70.5 in | Wt 221.2 lb

## 2022-10-16 DIAGNOSIS — E785 Hyperlipidemia, unspecified: Secondary | ICD-10-CM | POA: Diagnosis not present

## 2022-10-16 DIAGNOSIS — M1A09X0 Idiopathic chronic gout, multiple sites, without tophus (tophi): Secondary | ICD-10-CM

## 2022-10-16 DIAGNOSIS — Z87898 Personal history of other specified conditions: Secondary | ICD-10-CM

## 2022-10-16 DIAGNOSIS — R7303 Prediabetes: Secondary | ICD-10-CM

## 2022-10-16 DIAGNOSIS — I10 Essential (primary) hypertension: Secondary | ICD-10-CM

## 2022-10-16 LAB — LIPID PANEL
Cholesterol: 304 mg/dL — ABNORMAL HIGH (ref 0–200)
HDL: 45.4 mg/dL (ref >39.00)
Total CHOL/HDL Ratio: 7
Triglycerides: 1540 mg/dL — ABNORMAL HIGH (ref 0.0–149.0)

## 2022-10-16 LAB — HEMOGLOBIN A1C: Hgb A1c MFr Bld: 5.7 % (ref 4.6–6.5)

## 2022-10-16 LAB — COMPREHENSIVE METABOLIC PANEL
ALT: 24 U/L (ref 0–53)
AST: 25 U/L (ref 0–37)
Albumin: 4.4 g/dL (ref 3.5–5.2)
Alkaline Phosphatase: 67 U/L (ref 39–117)
BUN: 9 mg/dL (ref 6–23)
CO2: 24 mEq/L (ref 19–32)
Calcium: 10 mg/dL (ref 8.4–10.5)
Chloride: 102 mEq/L (ref 96–112)
Creatinine, Ser: 0.88 mg/dL (ref 0.40–1.50)
GFR: 96.49 mL/min (ref >60.00)
Glucose, Bld: 92 mg/dL (ref 70–99)
Potassium: 4 mEq/L (ref 3.5–5.1)
Sodium: 139 mEq/L (ref 135–145)
Total Bilirubin: 0.6 mg/dL (ref 0.2–1.2)
Total Protein: 7.3 g/dL (ref 6.0–8.3)

## 2022-10-16 LAB — LDL CHOLESTEROL, DIRECT: Direct LDL: 92 mg/dL

## 2022-10-16 NOTE — Progress Notes (Signed)
Subjective:  Patient ID: Dustin Logan, male    DOB: Aug 15, 1965  Age: 56 y.o. MRN: [id number]  CC:  Chief Complaint  Patient presents with   Medical Management of Chronic Issues    HPI Dustin Logan presents for  Follow up, no specific concerns.   Hypertension: Amlodipine 5 mg daily.  Denies any side effects, not monitoring readings at home.  BP Readings from Last 3 Encounters:  10/16/22 130/80  04/17/22 128/80  10/13/21 132/70   Lab Results  Component Value Date   CREATININE 1.00 04/17/2022   Prediabetes: Weight had increased last visit, down 4 pounds since that time.  Physical job, walking for exercise.  More active during the summer.  No current meds. Lab Results  Component Value Date   HGBA1C 5.9 04/17/2022   Wt Readings from Last 3 Encounters:  10/16/22 221 lb 3.2 oz (100.3 kg)  04/17/22 225 lb 9.6 oz (102.3 kg)  10/13/21 218 lb 12.8 oz (99.2 kg)   Gout: Last flare: none recently. Daily meds: Allopurinol 200 mg daily, no new side effects.  Prn med: Indomethacin if needed Lab Results  Component Value Date   LABURIC 5.4 02/05/2020   Insomnia: Discussed at his January visit.  No relief with melatonin in the past, felt groggy the morning after use of hydroxyzine.  We discussed sleep hygiene and option to meet with sleep specialist.  Additionally have discussed avoidance of fluids within hours of bedtime.  Sleep has been improved at his January visit with fluid avoidance.  Still doing ok. Rare wakening. Last few weeks no issues.   Health maintenance Had discussed colonoscopy at his physical, with Dr. Elnoria Howard in the past year or 2, plan for repeat 5 years. HIV, hepatitis C screening: Declines.  Tdap: declines.  Shingles vaccine: Declines Covid vaccine declines, plans on flu vaccine.  Immunization History  Administered Date(s) Administered   Influenza,inj,Quad PF,6+ Mos 04/14/2021    History Patient Active Problem List   Diagnosis Date Noted   Essential  hypertension, benign 03/29/2018   Tourette's syndrome 06/01/2015   Gout 10/03/2013   Past Medical History:  Diagnosis Date   Gout attack    Past Surgical History:  Procedure Laterality Date   HERNIA REPAIR     No Known Allergies Prior to Admission medications   Medication Sig Start Date End Date Taking? Authorizing Provider  allopurinol (ZYLOPRIM) 100 MG tablet TAKE 2 TABLETS BY MOUTH EVERY DAY 10/16/22  Yes Shade Flood, MD  amLODipine (NORVASC) 5 MG tablet TAKE 1 TABLET (5 MG TOTAL) BY MOUTH DAILY. 10/16/22  Yes Shade Flood, MD  indomethacin (INDOCIN) 50 MG capsule TAKE 1 CAPSULE BY MOUTH TWICE A DAY WITH A MEAL AS NEEDED FOR GOUT 11/07/21  Yes Shade Flood, MD   Social History   Socioeconomic History   Marital status: Married    Spouse name: Not on file   Number of children: Not on file   Years of education: Not on file   Highest education level: Not on file  Occupational History   Not on file  Tobacco Use   Smoking status: Some Days    Types: Cigars   Smokeless tobacco: Never   Tobacco comments:    smokes 10 cigars in a week  Substance and Sexual Activity   Alcohol use: Yes    Alcohol/week: 12.0 standard drinks of alcohol    Types: 12 Standard drinks or equivalent per week   Drug use: Not Currently  Sexual activity: Yes  Other Topics Concern   Not on file  Social History Narrative   Not on file   Social Determinants of Health   Financial Resource Strain: Not on file  Food Insecurity: Not on file  Transportation Needs: Not on file  Physical Activity: Not on file  Stress: Not on file  Social Connections: Not on file  Intimate Partner Violence: Not on file    Review of Systems  Constitutional:  Negative for fatigue and unexpected weight change.  Eyes:  Negative for visual disturbance.  Respiratory:  Negative for cough, chest tightness and shortness of breath.   Cardiovascular:  Negative for chest pain, palpitations and leg swelling.   Gastrointestinal:  Negative for abdominal pain and blood in stool.  Neurological:  Negative for dizziness, light-headedness and headaches.     Objective:   Vitals:   10/16/22 0939 10/16/22 1018  BP: 136/80 130/80  Pulse: 87   Temp: 97.9 F (36.6 C)   TempSrc: Temporal   SpO2: 97%   Weight: 221 lb 3.2 oz (100.3 kg)   Height: 5' 10.5" (1.791 m)      Physical Exam Vitals reviewed.  Constitutional:      Appearance: He is well-developed.  HENT:     Head: Normocephalic and atraumatic.  Neck:     Vascular: No carotid bruit or JVD.  Cardiovascular:     Rate and Rhythm: Normal rate and regular rhythm.     Heart sounds: Normal heart sounds. No murmur heard. Pulmonary:     Effort: Pulmonary effort is normal.     Breath sounds: Normal breath sounds. No rales.  Musculoskeletal:     Right lower leg: No edema.     Left lower leg: No edema.  Skin:    General: Skin is warm and dry.  Neurological:     Mental Status: He is alert and oriented to person, place, and time.  Psychiatric:        Mood and Affect: Mood normal.        Assessment & Plan:  Dustin Logan is a 57 y.o. male . Hyperlipidemia, unspecified hyperlipidemia type - Plan: Comprehensive metabolic panel, Lipid panel, Hemoglobin A1c  -Low-dose statin previously recommended.  Check labs, then likely will recommend further statin again.  Continue to watch diet, exercise.  More active recently.  Prediabetes - Plan: Comprehensive metabolic panel, Lipid panel, Hemoglobin A1c  -As above increase activity with some weight loss, anticipate improved A1c. Continue to watch diet, exercise.  Check labs and adjust plan accordingly.  Idiopathic chronic gout of multiple sites without tophus  -Stable on allopurinol, continue same.  Hypertension Borderline controlled.  Check home readings with option to increase meds if persistent elevations over 130/80.  Handout given on management of hypertension.  History of  insomnia  -Improved, RTC precautions.  Continue to avoid fluids just before bedtime.  No orders of the defined types were placed in this encounter.  Patient Instructions  No med changes at this time.  Blood pressure is borderline control, ideally would like to see that below 130/80, see information below.  If you have access to a blood pressure machine outside of the office it may be worthwhile to check that once every week or 2 and if home readings remain over 130/80 we can adjust her medications.  Let me know.  No other medication changes at this time.  Recheck in 6 months but sooner if elevated readings or new concerns/questions in the meantime.  Take  care!  Managing Your Hypertension Hypertension, also called high blood pressure, is when the force of the blood pressing against the walls of the arteries is too strong. Arteries are blood vessels that carry blood from your heart throughout your body. Hypertension forces the heart to work harder to pump blood and may cause the arteries to become narrow or stiff. Understanding blood pressure readings A blood pressure reading includes a higher number over a lower number: The first, or top, number is called the systolic pressure. It is a measure of the pressure in your arteries as your heart beats. The second, or bottom number, is called the diastolic pressure. It is a measure of the pressure in your arteries as the heart relaxes. For most people, a normal blood pressure is below 120/80. Your personal target blood pressure may vary depending on your medical conditions, your age, and other factors. Blood pressure is classified into four stages. Based on your blood pressure reading, your health care provider may use the following stages to determine what type of treatment you need, if any. Systolic pressure and diastolic pressure are measured in a unit called millimeters of mercury (mmHg). Normal Systolic pressure: below 120. Diastolic pressure: below  80. Elevated Systolic pressure: 120-129. Diastolic pressure: below 80. Hypertension stage 1 Systolic pressure: 130-139. Diastolic pressure: 80-89. Hypertension stage 2 Systolic pressure: 140 or above. Diastolic pressure: 90 or above. How can this condition affect me? Managing your hypertension is very important. Over time, hypertension can damage the arteries and decrease blood flow to parts of the body, including the brain, heart, and kidneys. Having untreated or uncontrolled hypertension can lead to: A heart attack. A stroke. A weakened blood vessel (aneurysm). Heart failure. Kidney damage. Eye damage. Memory and concentration problems. Vascular dementia. What actions can I take to manage this condition? Hypertension can be managed by making lifestyle changes and possibly by taking medicines. Your health care provider will help you make a plan to bring your blood pressure within a normal range. You may be referred for counseling on a healthy diet and physical activity. Nutrition  Eat a diet that is high in fiber and potassium, and low in salt (sodium), added sugar, and fat. An example eating plan is called the DASH diet. DASH stands for Dietary Approaches to Stop Hypertension. To eat this way: Eat plenty of fresh fruits and vegetables. Try to fill one-half of your plate at each meal with fruits and vegetables. Eat whole grains, such as whole-wheat pasta, brown rice, or whole-grain bread. Fill about one-fourth of your plate with whole grains. Eat low-fat dairy products. Avoid fatty cuts of meat, processed or cured meats, and poultry with skin. Fill about one-fourth of your plate with lean proteins such as fish, chicken without skin, beans, eggs, and tofu. Avoid pre-made and processed foods. These tend to be higher in sodium, added sugar, and fat. Reduce your daily sodium intake. Many people with hypertension should eat less than 1,500 mg of sodium a day. Lifestyle  Work with your  health care provider to maintain a healthy body weight or to lose weight. Ask what an ideal weight is for you. Get at least 30 minutes of exercise that causes your heart to beat faster (aerobic exercise) most days of the week. Activities may include walking, swimming, or biking. Include exercise to strengthen your muscles (resistance exercise), such as weight lifting, as part of your weekly exercise routine. Try to do these types of exercises for 30 minutes at least 3 days  a week. Do not use any products that contain nicotine or tobacco. These products include cigarettes, chewing tobacco, and vaping devices, such as e-cigarettes. If you need help quitting, ask your health care provider. Control any long-term (chronic) conditions you have, such as high cholesterol or diabetes. Identify your sources of stress and find ways to manage stress. This may include meditation, deep breathing, or making time for fun activities. Alcohol use Do not drink alcohol if: Your health care provider tells you not to drink. You are pregnant, may be pregnant, or are planning to become pregnant. If you drink alcohol: Limit how much you have to: 0-1 drink a day for women. 0-2 drinks a day for men. Know how much alcohol is in your drink. In the [place] one drink equals one 12 oz bottle of beer (355 mL), one 5 oz glass of wine (148 mL), or one 1 oz glass of hard liquor (44 mL). Medicines Your health care provider may prescribe medicine if lifestyle changes are not enough to get your blood pressure under control and if: Your systolic blood pressure is 130 or higher. Your diastolic blood pressure is 80 or higher. Take medicines only as told by your health care provider. Follow the directions carefully. Blood pressure medicines must be taken as told by your health care provider. The medicine does not work as well when you skip doses. Skipping doses also puts you at risk for problems. Monitoring Before you monitor your blood  pressure: Do not smoke, drink caffeinated beverages, or exercise within 30 minutes before taking a measurement. Use the bathroom and empty your bladder (urinate). Sit quietly for at least 5 minutes before taking measurements. Monitor your blood pressure at home as told by your health care provider. To do this: Sit with your back straight and supported. Place your feet flat on the floor. Do not cross your legs. Support your arm on a flat surface, such as a table. Make sure your upper arm is at heart level. Each time you measure, take two or three readings one minute apart and record the results. You may also need to have your blood pressure checked regularly by your health care provider. General information Talk with your health care provider about your diet, exercise habits, and other lifestyle factors that may be contributing to hypertension. Review all the medicines you take with your health care provider because there may be side effects or interactions. Keep all follow-up visits. Your health care provider can help you create and adjust your plan for managing your high blood pressure. Where to find more information National Heart, Lung, and Blood Institute: [employer]is American Heart Association: www.heart.org Contact a health care provider if: You think you are having a reaction to medicines you have taken. You have repeated (recurrent) headaches. You feel dizzy. You have swelling in your ankles. You have trouble with your vision. Get help right away if: You develop a severe headache or confusion. You have unusual weakness or numbness, or you feel faint. You have severe pain in your chest or abdomen. You vomit repeatedly. You have trouble breathing. These symptoms may be an emergency. Get help right away. Call 911. Do not wait to see if the symptoms will go away. Do not drive yourself to the hospital. Summary Hypertension is when the force of blood pumping through your  arteries is too strong. If this condition is not controlled, it may put you at risk for serious complications. Your personal target blood pressure may vary depending on  your medical conditions, your age, and other factors. For most people, a normal blood pressure is less than 120/80. Hypertension is managed by lifestyle changes, medicines, or both. Lifestyle changes to help manage hypertension include losing weight, eating a healthy, low-sodium diet, exercising more, stopping smoking, and limiting alcohol. This information is not intended to replace advice given to you by your health care provider. Make sure you discuss any questions you have with your health care provider. Document Revised: 11/25/2020 Document Reviewed: 11/25/2020 Elsevier Patient Education  2024 [employer] Inc.     Signed,   Meredith Staggers, MD [facility], [facility] 10/16/22 10:25 AM

## 2022-10-16 NOTE — Patient Instructions (Signed)
No med changes at this time.  Blood pressure is borderline control, ideally would like to see that below 130/80, see information below.  If you have access to a blood pressure machine outside of the office it may be worthwhile to check that once every week or 2 and if home readings remain over 130/80 we can adjust her medications.  Let me know.  No other medication changes at this time.  Recheck in 6 months but sooner if elevated readings or new concerns/questions in the meantime.  Take care!  Managing Your Hypertension Hypertension, also called high blood pressure, is when the force of the blood pressing against the walls of the arteries is too strong. Arteries are blood vessels that carry blood from your heart throughout your body. Hypertension forces the heart to work harder to pump blood and may cause the arteries to become narrow or stiff. Understanding blood pressure readings A blood pressure reading includes a higher number over a lower number: The first, or top, number is called the systolic pressure. It is a measure of the pressure in your arteries as your heart beats. The second, or bottom number, is called the diastolic pressure. It is a measure of the pressure in your arteries as the heart relaxes. For most people, a normal blood pressure is below 120/80. Your personal target blood pressure may vary depending on your medical conditions, your age, and other factors. Blood pressure is classified into four stages. Based on your blood pressure reading, your health care provider may use the following stages to determine what type of treatment you need, if any. Systolic pressure and diastolic pressure are measured in a unit called millimeters of mercury (mmHg). Normal Systolic pressure: below 120. Diastolic pressure: below 80. Elevated Systolic pressure: 120-129. Diastolic pressure: below 80. Hypertension stage 1 Systolic pressure: 130-139. Diastolic pressure: 80-89. Hypertension stage  2 Systolic pressure: 140 or above. Diastolic pressure: 90 or above. How can this condition affect me? Managing your hypertension is very important. Over time, hypertension can damage the arteries and decrease blood flow to parts of the body, including the brain, heart, and kidneys. Having untreated or uncontrolled hypertension can lead to: A heart attack. A stroke. A weakened blood vessel (aneurysm). Heart failure. Kidney damage. Eye damage. Memory and concentration problems. Vascular dementia. What actions can I take to manage this condition? Hypertension can be managed by making lifestyle changes and possibly by taking medicines. Your health care provider will help you make a plan to bring your blood pressure within a normal range. You may be referred for counseling on a healthy diet and physical activity. Nutrition  Eat a diet that is high in fiber and potassium, and low in salt (sodium), added sugar, and fat. An example eating plan is called the DASH diet. DASH stands for Dietary Approaches to Stop Hypertension. To eat this way: Eat plenty of fresh fruits and vegetables. Try to fill one-half of your plate at each meal with fruits and vegetables. Eat whole grains, such as whole-wheat pasta, brown rice, or whole-grain bread. Fill about one-fourth of your plate with whole grains. Eat low-fat dairy products. Avoid fatty cuts of meat, processed or cured meats, and poultry with skin. Fill about one-fourth of your plate with lean proteins such as fish, chicken without skin, beans, eggs, and tofu. Avoid pre-made and processed foods. These tend to be higher in sodium, added sugar, and fat. Reduce your daily sodium intake. Many people with hypertension should eat less than 1,500 mg of  sodium a day. Lifestyle  Work with your health care provider to maintain a healthy body weight or to lose weight. Ask what an ideal weight is for you. Get at least 30 minutes of exercise that causes your heart to  beat faster (aerobic exercise) most days of the week. Activities may include walking, swimming, or biking. Include exercise to strengthen your muscles (resistance exercise), such as weight lifting, as part of your weekly exercise routine. Try to do these types of exercises for 30 minutes at least 3 days a week. Do not use any products that contain nicotine or tobacco. These products include cigarettes, chewing tobacco, and vaping devices, such as e-cigarettes. If you need help quitting, ask your health care provider. Control any long-term (chronic) conditions you have, such as high cholesterol or diabetes. Identify your sources of stress and find ways to manage stress. This may include meditation, deep breathing, or making time for fun activities. Alcohol use Do not drink alcohol if: Your health care provider tells you not to drink. You are pregnant, may be pregnant, or are planning to become pregnant. If you drink alcohol: Limit how much you have to: 0-1 drink a day for women. 0-2 drinks a day for men. Know how much alcohol is in your drink. In the [place] one drink equals one 12 oz bottle of beer (355 mL), one 5 oz glass of wine (148 mL), or one 1 oz glass of hard liquor (44 mL). Medicines Your health care provider may prescribe medicine if lifestyle changes are not enough to get your blood pressure under control and if: Your systolic blood pressure is 130 or higher. Your diastolic blood pressure is 80 or higher. Take medicines only as told by your health care provider. Follow the directions carefully. Blood pressure medicines must be taken as told by your health care provider. The medicine does not work as well when you skip doses. Skipping doses also puts you at risk for problems. Monitoring Before you monitor your blood pressure: Do not smoke, drink caffeinated beverages, or exercise within 30 minutes before taking a measurement. Use the bathroom and empty your bladder (urinate). Sit  quietly for at least 5 minutes before taking measurements. Monitor your blood pressure at home as told by your health care provider. To do this: Sit with your back straight and supported. Place your feet flat on the floor. Do not cross your legs. Support your arm on a flat surface, such as a table. Make sure your upper arm is at heart level. Each time you measure, take two or three readings one minute apart and record the results. You may also need to have your blood pressure checked regularly by your health care provider. General information Talk with your health care provider about your diet, exercise habits, and other lifestyle factors that may be contributing to hypertension. Review all the medicines you take with your health care provider because there may be side effects or interactions. Keep all follow-up visits. Your health care provider can help you create and adjust your plan for managing your high blood pressure. Where to find more information National Heart, Lung, and Blood Institute: [employer]is American Heart Association: www.heart.org Contact a health care provider if: You think you are having a reaction to medicines you have taken. You have repeated (recurrent) headaches. You feel dizzy. You have swelling in your ankles. You have trouble with your vision. Get help right away if: You develop a severe headache or confusion. You have unusual weakness or  numbness, or you feel faint. You have severe pain in your chest or abdomen. You vomit repeatedly. You have trouble breathing. These symptoms may be an emergency. Get help right away. Call 911. Do not wait to see if the symptoms will go away. Do not drive yourself to the hospital. Summary Hypertension is when the force of blood pumping through your arteries is too strong. If this condition is not controlled, it may put you at risk for serious complications. Your personal target blood pressure may vary depending on your  medical conditions, your age, and other factors. For most people, a normal blood pressure is less than 120/80. Hypertension is managed by lifestyle changes, medicines, or both. Lifestyle changes to help manage hypertension include losing weight, eating a healthy, low-sodium diet, exercising more, stopping smoking, and limiting alcohol. This information is not intended to replace advice given to you by your health care provider. Make sure you discuss any questions you have with your health care provider. Document Revised: 11/25/2020 Document Reviewed: 11/25/2020 Elsevier Patient Education  2024 [employer].

## 2022-10-20 ENCOUNTER — Other Ambulatory Visit: Payer: Self-pay | Admitting: Family Medicine

## 2022-10-20 DIAGNOSIS — E781 Pure hyperglyceridemia: Secondary | ICD-10-CM

## 2022-10-20 DIAGNOSIS — E785 Hyperlipidemia, unspecified: Secondary | ICD-10-CM

## 2022-10-20 MED ORDER — ATORVASTATIN CALCIUM 10 MG PO TABS: 10.0000 mg | ORAL_TABLET | Freq: Every day | ORAL | 0 refills | Status: DC

## 2022-10-20 NOTE — Progress Notes (Unsigned)
Discussed with patient in office today

## 2022-10-23 ENCOUNTER — Encounter: Payer: Self-pay | Admitting: Family Medicine

## 2022-10-23 ENCOUNTER — Ambulatory Visit (INDEPENDENT_AMBULATORY_CARE_PROVIDER_SITE_OTHER): Payer: No Typology Code available for payment source | Admitting: Family Medicine

## 2022-10-23 VITALS — BP 136/82 | HR 68 | Temp 97.7°F | Resp 18 | Ht 70.5 in | Wt 225.2 lb

## 2022-10-23 DIAGNOSIS — E782 Mixed hyperlipidemia: Secondary | ICD-10-CM

## 2022-10-23 NOTE — Progress Notes (Signed)
Pt reports he would like to discuss this before taking medication. Pt has pick up the Lipitor but does not want to take this medication. Pt will make lab appt. If him and Dr.Greene agree after visit.

## 2022-10-23 NOTE — Progress Notes (Signed)
Subjective:  Patient ID: Dustin Logan, male    DOB: 1965/09/05  Age: 57 y.o. MRN: [id number]  CC:  Chief Complaint  Patient presents with   Follow-up    Pt is here to discuss labs  Doing well per pt     HPI Dustin Logan presents for   Hyperlipidemia: Elevated labs last week. Lemonade but no food prior to last visit. Some alcohol 2 days prior - 6 pack, none day prior. Drinks about 6 beers on 2-3 days per week.  No abd pain/n/v.  Planned starting low dose statin initially - concerned about possible side effects.   The 10-year ASCVD risk score (Arnett DK, et al., 2019) is: 22.6%   Values used to calculate the score:     Age: 16 years     Sex: Male     Is Non-Hispanic African American: No     Diabetic: No     Tobacco smoker: Yes     Systolic Blood Pressure: 136 mmHg     Is BP treated: Yes     HDL Cholesterol: 45.4 mg/dL     Total Cholesterol: 304 mg/dL    Lab Results  Component Value Date   CHOL 304 (H) 10/16/2022   HDL 45.40 10/16/2022   LDLCALC 127 (H) 04/14/2021   LDLDIRECT 92.0 10/16/2022   TRIG (H) 10/16/2022    1540.0 Triglyceride is over 400; calculations on Lipids are invalid.   CHOLHDL 7 10/16/2022   Lab Results  Component Value Date   ALT 24 10/16/2022   AST 25 10/16/2022   ALKPHOS 67 10/16/2022   BILITOT 0.6 10/16/2022       History Patient Active Problem List   Diagnosis Date Noted   Essential hypertension, benign 03/29/2018   Tourette's syndrome 06/01/2015   Gout 10/03/2013   Past Medical History:  Diagnosis Date   Gout attack    Past Surgical History:  Procedure Laterality Date   HERNIA REPAIR     No Known Allergies Prior to Admission medications   Medication Sig Start Date End Date Taking? Authorizing Provider  allopurinol (ZYLOPRIM) 100 MG tablet TAKE 2 TABLETS BY MOUTH EVERY DAY 10/16/22  Yes Shade Flood, MD  amLODipine (NORVASC) 5 MG tablet TAKE 1 TABLET (5 MG TOTAL) BY MOUTH DAILY. 10/16/22  Yes Shade Flood, MD   atorvastatin (LIPITOR) 10 MG tablet Take 1 tablet (10 mg total) by mouth daily. Patient not taking: Reported on 10/23/2022 10/20/22   Shade Flood, MD  indomethacin (INDOCIN) 50 MG capsule TAKE 1 CAPSULE BY MOUTH TWICE A DAY WITH A MEAL AS NEEDED FOR GOUT Patient not taking: Reported on 10/23/2022 11/07/21   Shade Flood, MD   Social History   Socioeconomic History   Marital status: Married    Spouse name: Not on file   Number of children: Not on file   Years of education: Not on file   Highest education level: Not on file  Occupational History   Not on file  Tobacco Use   Smoking status: Some Days    Types: Cigars   Smokeless tobacco: Never   Tobacco comments:    smokes 10 cigars in a week  Substance and Sexual Activity   Alcohol use: Yes    Alcohol/week: 12.0 standard drinks of alcohol    Types: 12 Standard drinks or equivalent per week   Drug use: Not Currently   Sexual activity: Yes  Other Topics Concern   Not on file  Social History Narrative   Not on file   Social Determinants of Health   Financial Resource Strain: Not on file  Food Insecurity: Not on file  Transportation Needs: Not on file  Physical Activity: Not on file  Stress: Not on file  Social Connections: Not on file  Intimate Partner Violence: Not on file    Review of Systems   Objective:   Vitals:   10/23/22 1522  BP: 136/82  Pulse: 68  Resp: 18  Temp: 97.7 F (36.5 C)  TempSrc: Temporal  SpO2: 98%  Weight: 225 lb 4 oz (102.2 kg)  Height: 5' 10.5" (1.791 m)     Physical Exam Constitutional:      General: He is not in acute distress.    Appearance: Normal appearance. He is well-developed.  HENT:     Head: Normocephalic and atraumatic.  Cardiovascular:     Rate and Rhythm: Normal rate.  Pulmonary:     Effort: Pulmonary effort is normal.  Abdominal:     General: Abdomen is flat. Bowel sounds are normal. There is no distension.     Tenderness: There is no abdominal  tenderness. There is no guarding.  Neurological:     Mental Status: He is alert and oriented to person, place, and time.  Psychiatric:        Mood and Affect: Mood normal.        Assessment & Plan:  Dustin Logan is a 57 y.o. male . Mixed hyperlipidemia Repeat labs, and then can make further recommendations on medication, still may recommend statin versus fenofibrate.  Dietary advice has been given and reinforced today including cutting back on alcohol.  Pancreatitis risk with significant hypertriglyceridemia discussed with discussion of signs or symptoms and ER precautions.  All questions answered.  No orders of the defined types were placed in this encounter.  Patient Instructions  I will recheck labs today, and then we can discuss medication options further.  As we discussed with the high triglycerides watch for any abdominal pain, nausea or vomiting and be seen right away if that occurs.  See information below on diet, cut back on alcohol by half for now.  Thanks for coming in today and please let me know if there are further questions.  Triglycerides Test Why am I having this test? Triglycerides are a type of fat in the body. Having a high level of triglycerides can increase your risk for heart disease. You may have this test as part of a routine physical exam. Health care providers recommend that adults have this test at least once every 5 years. If you have risk factors for heart disease or are being treated for high triglycerides, you may need to have this test more often. What is being tested? This test measures the amount of triglycerides in your blood. Triglycerides are naturally present in the body, and you also take in triglycerides by eating certain foods. Triglycerides can come from refined carbohydrate foods, which are foods high in added sugars and low in nutrients and fiber, and from alcohol. Triglycerides store energy eaten at meals for use between meals. If you eat  more than you burn, this extra energy can result in high triglycerides. Triglycerides may be measured as part of a test called a lipid profile, which tests triglycerides and cholesterol levels. What kind of sample is taken?  A blood sample is required for this test. It may be collected by inserting a needle into a blood vessel or by pricking  a fingertip with a small needle (finger stick). How do I prepare for this test? Follow instructions from your health care provider about changing or stopping your regular medicines. Do not eat or drink anything except water starting 9-12 hours before your test, or as long as told by your health care provider. Do not drink alcohol starting at least 24 hours before your test. Follow any instructions from your health care provider about dietary restrictions before your test. Tell a health care provider about: All medicines you are taking, including vitamins, herbs, eye drops, creams, and over-the-counter medicines. Any bleeding problems you have. Any medical conditions you have. How are the results reported? Your test results will be reported as a value that indicates how many triglycerides are in your blood. This will be given as milligrams of triglycerides per deciliter of blood (mg/dL). Your health care provider will compare your results to normal values that were established after testing a large group of people (reference ranges). Reference ranges may vary among labs and hospitals. For this test, common reference ranges are: Adults: Male: 40-160 mg/dL or 1.61-0.96 mmol/L (SI units). Male: 35-135 mg/dL or 0.45-4.09 mmol/L (SI units). Teens 29-2 years old: Male: 40-163 mg/dL. Male: 40-128 mg/dL. Children 40-14 years old: Male: 36-138 mg/dL. Male: 41-138 mg/dL. Children 64-24 years old: Male: 31-108 mg/dL. Male: 35-114 mg/dL. Children 5 years or younger: Male: 30-86 mg/dL. Male: 32-99 mg/dL. What do the results mean? Results that are  within the reference range are considered normal. This means that you have a normal amount of triglycerides in your blood. Results that are higher than your reference range mean that there are too many triglycerides in your blood. This may mean that you: Have a higher risk of heart disease. Have certain diseases that cause high triglycerides, such as diabetes. Are taking certain medicines such as estrogens and oral contraceptives. Talk with your health care provider about what your results mean. Questions to ask your health care provider Ask your health care provider, or the department that is doing the test: When will my results be ready? How will I get my results? What are my treatment options? What other tests do I need? What are my next steps? Summary Triglycerides are a type of fat in the body. Having a high level of triglycerides can increase your risk for heart disease. You may have this test as part of a routine physical exam. Triglycerides may be measured as part of a test called a lipid profile, which tests triglycerides and cholesterol. Talk with your health care provider about what your results mean. This information is not intended to replace advice given to you by your health care provider. Make sure you discuss any questions you have with your health care provider. Document Revised: 12/01/2021 Document Reviewed: 06/23/2020 Elsevier Patient Education  2024 Elsevier Inc.       Signed,   Meredith Staggers, MD [facility], [facility] 10/23/22 4:09 PM

## 2022-10-23 NOTE — Patient Instructions (Addendum)
I will recheck labs today, and then we can discuss medication options further.  As we discussed with the high triglycerides watch for any abdominal pain, nausea or vomiting and be seen right away if that occurs.  See information below on diet, cut back on alcohol by half for now.  Thanks for coming in today and please let me know if there are further questions.  Triglycerides Test Why am I having this test? Triglycerides are a type of fat in the body. Having a high level of triglycerides can increase your risk for heart disease. You may have this test as part of a routine physical exam. Health care providers recommend that adults have this test at least once every 5 years. If you have risk factors for heart disease or are being treated for high triglycerides, you may need to have this test more often. What is being tested? This test measures the amount of triglycerides in your blood. Triglycerides are naturally present in the body, and you also take in triglycerides by eating certain foods. Triglycerides can come from refined carbohydrate foods, which are foods high in added sugars and low in nutrients and fiber, and from alcohol. Triglycerides store energy eaten at meals for use between meals. If you eat more than you burn, this extra energy can result in high triglycerides. Triglycerides may be measured as part of a test called a lipid profile, which tests triglycerides and cholesterol levels. What kind of sample is taken?  A blood sample is required for this test. It may be collected by inserting a needle into a blood vessel or by pricking a fingertip with a small needle (finger stick). How do I prepare for this test? Follow instructions from your health care provider about changing or stopping your regular medicines. Do not eat or drink anything except water starting 9-12 hours before your test, or as long as told by your health care provider. Do not drink alcohol starting at least 24 hours  before your test. Follow any instructions from your health care provider about dietary restrictions before your test. Tell a health care provider about: All medicines you are taking, including vitamins, herbs, eye drops, creams, and over-the-counter medicines. Any bleeding problems you have. Any medical conditions you have. How are the results reported? Your test results will be reported as a value that indicates how many triglycerides are in your blood. This will be given as milligrams of triglycerides per deciliter of blood (mg/dL). Your health care provider will compare your results to normal values that were established after testing a large group of people (reference ranges). Reference ranges may vary among labs and hospitals. For this test, common reference ranges are: Adults: Male: 40-160 mg/dL or 2.13-0.86 mmol/L (SI units). Male: 35-135 mg/dL or 5.78-4.69 mmol/L (SI units). Teens 50-22 years old: Male: 40-163 mg/dL. Male: 40-128 mg/dL. Children 21-54 years old: Male: 36-138 mg/dL. Male: 41-138 mg/dL. Children 60-16 years old: Male: 31-108 mg/dL. Male: 35-114 mg/dL. Children 5 years or younger: Male: 30-86 mg/dL. Male: 32-99 mg/dL. What do the results mean? Results that are within the reference range are considered normal. This means that you have a normal amount of triglycerides in your blood. Results that are higher than your reference range mean that there are too many triglycerides in your blood. This may mean that you: Have a higher risk of heart disease. Have certain diseases that cause high triglycerides, such as diabetes. Are taking certain medicines such as estrogens and oral contraceptives. Talk with your  health care provider about what your results mean. Questions to ask your health care provider Ask your health care provider, or the department that is doing the test: When will my results be ready? How will I get my results? What are my treatment  options? What other tests do I need? What are my next steps? Summary Triglycerides are a type of fat in the body. Having a high level of triglycerides can increase your risk for heart disease. You may have this test as part of a routine physical exam. Triglycerides may be measured as part of a test called a lipid profile, which tests triglycerides and cholesterol. Talk with your health care provider about what your results mean. This information is not intended to replace advice given to you by your health care provider. Make sure you discuss any questions you have with your health care provider. Document Revised: 12/01/2021 Document Reviewed: 06/23/2020 Elsevier Patient Education  2024 [employer].

## 2023-01-13 ENCOUNTER — Other Ambulatory Visit: Payer: Self-pay | Admitting: Family Medicine

## 2023-01-13 DIAGNOSIS — E785 Hyperlipidemia, unspecified: Secondary | ICD-10-CM

## 2023-01-13 DIAGNOSIS — E781 Pure hyperglyceridemia: Secondary | ICD-10-CM

## 2023-04-19 ENCOUNTER — Encounter: Payer: Self-pay | Admitting: Family Medicine

## 2023-05-07 ENCOUNTER — Encounter: Payer: Self-pay | Admitting: Family Medicine

## 2023-06-14 ENCOUNTER — Ambulatory Visit (INDEPENDENT_AMBULATORY_CARE_PROVIDER_SITE_OTHER): Payer: Self-pay | Admitting: Family Medicine

## 2023-06-14 ENCOUNTER — Encounter: Payer: Self-pay | Admitting: Family Medicine

## 2023-06-14 VITALS — BP 126/80 | HR 84 | Temp 98.9°F | Ht 70.5 in | Wt 236.0 lb

## 2023-06-14 DIAGNOSIS — I10 Essential (primary) hypertension: Secondary | ICD-10-CM

## 2023-06-14 DIAGNOSIS — M1A09X0 Idiopathic chronic gout, multiple sites, without tophus (tophi): Secondary | ICD-10-CM

## 2023-06-14 DIAGNOSIS — Z125 Encounter for screening for malignant neoplasm of prostate: Secondary | ICD-10-CM

## 2023-06-14 DIAGNOSIS — E785 Hyperlipidemia, unspecified: Secondary | ICD-10-CM

## 2023-06-14 DIAGNOSIS — Z0000 Encounter for general adult medical examination without abnormal findings: Secondary | ICD-10-CM

## 2023-06-14 DIAGNOSIS — E781 Pure hyperglyceridemia: Secondary | ICD-10-CM

## 2023-06-14 DIAGNOSIS — R7303 Prediabetes: Secondary | ICD-10-CM

## 2023-06-14 MED ORDER — AMLODIPINE BESYLATE 5 MG PO TABS
5.0000 mg | ORAL_TABLET | Freq: Every day | ORAL | 1 refills | Status: AC
Start: 2023-06-14 — End: ?

## 2023-06-14 MED ORDER — ALLOPURINOL 100 MG PO TABS
200.0000 mg | ORAL_TABLET | Freq: Every day | ORAL | 1 refills | Status: AC
Start: 2023-06-14 — End: ?

## 2023-06-14 NOTE — Progress Notes (Signed)
 Subjective:  Patient ID: Dustin Logan, male    DOB: 09-02-1965  Age: 58 y.o. MRN: [id number]  CC:  Chief Complaint  Patient presents with   Annual Exam    Pt is well, no concerns, pt is not fasting     HPI Dustin Logan presents for Annual Exam   PCP, [facility], Dr. Elnoria Howard, colonoscopy in 2022. Repeat 5 years.  Few polyps removed.  Diverticulosis in sigmoid and transverse colon  Gout: Last flare: possible slight soreness in R great toe. Resolved in few hours, no meds needed. No true gout flare recently.  Daily meds: Allopurinol 200 mg daily. Prn med: Indomethacin if needed Lab Results  Component Value Date   LABURIC 5.4 02/05/2020   Nicotine use: Cigars - lessening use - went up to 5 weeks with 3 cigars.  Doing better.   Prediabetes: Borderline prediabetic on most recent labs.  Diet/exercise approach and he has a very active job with yard work, walking for exercise.  Slower pace during the winter with less activity. 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 More water, less sodas.  Lab Results  Component Value Date   HGBA1C 5.6 06/14/2023   Wt Readings from Last 3 Encounters:  06/14/23 236 lb (107 kg)  10/23/22 225 lb 4 oz (102.2 kg)  10/16/22 221 lb 3.2 oz (100.3 kg)   Hypertension: Amlodipine 5mg  every day. No new side effects, or edema.  Home readings: none.  BP Readings from Last 3 Encounters:  06/14/23 126/80  10/23/22 136/82  10/16/22 130/80   Lab Results  Component Value Date   CREATININE 0.95 06/14/2023    Hyperlipidemia: Mixed hyperlipidemia, recommended Lipitor 10 mg daily, some concern about possible side effects initially. Decided against taking statin.  Potential risks including pancreatitis risk discussed of hypertriglyceridemia.  Elevated triglycerides noted previously.  6 beers up to 2 to 3 days/week denies abdominal pain, nausea, vomiting.  Last labs in 2024, elevated triglycerides at that time.    Cut back on alcohol - depends on the week - sometimes 4-5 per week, sometimes few more or less.  Last alcohol 5 days ago.  No abd pain/n/v.    Lab Results  Component Value Date   CHOL 217 (H) 06/14/2023   HDL 55.70 06/14/2023   LDLCALC 127 (H) 04/14/2021   LDLDIRECT 111.0 06/14/2023   TRIG (H) 06/14/2023    581.0 Triglyceride is over 400; calculations on Lipids are invalid.   CHOLHDL 4 06/14/2023   Lab Results  Component Value Date   ALT 33 06/14/2023   AST 41 (H) 06/14/2023   ALKPHOS 63 06/14/2023   BILITOT 0.5 06/14/2023         06/14/2023    2:57 PM 10/16/2022    9:37 AM 04/17/2022    8:44 AM 10/13/2021    8:45 AM 04/14/2021    8:54 AM  Depression screen PHQ 2/9  Decreased Interest 0 0 0 0 0  Down, Depressed, Hopeless 0 0 0 0 0  PHQ - 2 Score 0 0 0 0 0  Altered sleeping 0 0 0    Tired, decreased energy 0 0 0    Change in appetite 0 0 0    Feeling bad or failure about yourself  0 0 0    Trouble concentrating 0 0 0    Moving slowly or fidgety/restless 0 0 0    Suicidal thoughts 0 0 0    PHQ-9 Score  0 0 0      Health Maintenance  Topic Date Due   Pneumococcal Vaccine 51-20 Years old (1 of 2 - PCV) Never done   Zoster Vaccines- Shingrix (1 of 2) Never done   INFLUENZA VACCINE  10/26/2022   Colonoscopy  12/29/2030   HPV VACCINES  Aged Out   DTaP/Tdap/Td  Discontinued   COVID-19 Vaccine  Discontinued   Hepatitis C Screening  Discontinued   HIV Screening  Discontinued  Colon ca screening as above.  Prostate: does not have family history of prostate cancer The natural history of prostate cancer and ongoing controversy regarding screening and potential treatment outcomes of prostate cancer has been discussed with the patient. The meaning of a false positive PSA and a false negative PSA has been discussed. He indicates understanding of the limitations of this screening test and wishes not  to proceed with screening PSA testing. Lab Results  Component Value Date    PSA1 0.6 06/27/2018   PSA 0.54 04/17/2022      Immunization History  Administered Date(s) Administered   Influenza,inj,Quad PF,6+ Mos 04/14/2021  Declines all vaccines at this time.   No results found. No recent visit - no new vision changes. Uses readers  Dental:dentures.   Alcohol:as above  Tobacco: rare cigar as above.   Exercise: as above.    History Patient Active Problem List   Diagnosis Date Noted   Essential hypertension, benign 03/29/2018   Tourette's syndrome 06/01/2015   Gout 10/03/2013   Past Medical History:  Diagnosis Date   Gout attack    Past Surgical History:  Procedure Laterality Date   HERNIA REPAIR     No Known Allergies Prior to Admission medications   Medication Sig Start Date End Date Taking? Authorizing Provider  allopurinol (ZYLOPRIM) 100 MG tablet TAKE 2 TABLETS BY MOUTH EVERY DAY 10/16/22  Yes Shade Flood, MD  amLODipine (NORVASC) 5 MG tablet TAKE 1 TABLET (5 MG TOTAL) BY MOUTH DAILY. 10/16/22  Yes Shade Flood, MD  atorvastatin (LIPITOR) 10 MG tablet TAKE 1 TABLET BY MOUTH EVERY DAY 01/15/23  Yes Shade Flood, MD  indomethacin (INDOCIN) 50 MG capsule TAKE 1 CAPSULE BY MOUTH TWICE A DAY WITH A MEAL AS NEEDED FOR GOUT Patient not taking: Reported on 06/14/2023 11/07/21   Shade Flood, MD   Social History   Socioeconomic History   Marital status: Married    Spouse name: Not on file   Number of children: Not on file   Years of education: Not on file   Highest education level: Not on file  Occupational History   Not on file  Tobacco Use   Smoking status: Some Days    Types: Cigars   Smokeless tobacco: Never   Tobacco comments:    smokes 10 cigars in a week  Substance and Sexual Activity   Alcohol use: Yes    Alcohol/week: 12.0 standard drinks of alcohol    Types: 12 Standard drinks or equivalent per week   Drug use: Not Currently   Sexual activity: Yes  Other Topics Concern   Not on file  Social History  Narrative   Not on file   Social Drivers of Health   Financial Resource Strain: Not on file  Food Insecurity: Not on file  Transportation Needs: Not on file  Physical Activity: Not on file  Stress: Not on file  Social Connections: Not on file  Intimate Partner Violence: Not on file    Review of  Systems 13 point review of systems per patient health survey noted.  Negative other than as indicated above or in HPI.    Objective:   Vitals:   06/14/23 1454  BP: 126/80  Pulse: 84  Temp: 98.9 F (37.2 C)  TempSrc: Temporal  SpO2: 100%  Weight: 236 lb (107 kg)  Height: 5' 10.5" (1.791 m)     Physical Exam Vitals reviewed.  Constitutional:      Appearance: He is well-developed.  HENT:     Head: Normocephalic and atraumatic.     Right Ear: External ear normal.     Left Ear: External ear normal.  Eyes:     Conjunctiva/sclera: Conjunctivae normal.     Pupils: Pupils are equal, round, and reactive to light.  Neck:     Thyroid: No thyromegaly.  Cardiovascular:     Rate and Rhythm: Normal rate and regular rhythm.     Heart sounds: Normal heart sounds.  Pulmonary:     Effort: Pulmonary effort is normal. No respiratory distress.     Breath sounds: Normal breath sounds. No wheezing.  Abdominal:     General: There is no distension.     Palpations: Abdomen is soft.     Tenderness: There is no abdominal tenderness.  Musculoskeletal:        General: No tenderness. Normal range of motion.     Cervical back: Normal range of motion and neck supple.  Lymphadenopathy:     Cervical: No cervical adenopathy.  Skin:    General: Skin is warm and dry.  Neurological:     Mental Status: He is alert and oriented to person, place, and time.     Deep Tendon Reflexes: Reflexes are normal and symmetric.  Psychiatric:        Behavior: Behavior normal.        Assessment & Plan:  SANI MADARIAGA is a 58 y.o. male . Annual physical exam - Plan: Lipid panel, Comprehensive metabolic  panel, Hemoglobin A1c  - -anticipatory guidance as below in AVS, screening labs above. Health maintenance items as above in HPI discussed/recommended as applicable.   Essential hypertension - Plan: amLODipine (NORVASC) 5 MG tablet  -Tolerating current dose of amlodipine, continue same.  Screening for prostate cancer  -PSA declined, consider next physical.  Hyperlipidemia, unspecified hyperlipidemia type - Plan: Lipid panel, Comprehensive metabolic panel  -With primarily hypertriglyceridemia.  Repeat labs obtained, may need to consider medication with triglycerides over 500, will discuss options with patient.  Potential risk for pancreatitis discussed.  Prediabetes - Plan: Comprehensive metabolic panel, Hemoglobin A1c  -Repeat A1c improved.  Continue to stay active, watch diet.  Hypertriglyceridemia  -As above.  Idiopathic chronic gout of multiple sites without tophus - Plan: allopurinol (ZYLOPRIM) 100 MG tablet  -Stable with allopurinol prevention, RTC precautions.  Meds ordered this encounter  Medications   amLODipine (NORVASC) 5 MG tablet    Sig: Take 1 tablet (5 mg total) by mouth daily.    Dispense:  90 tablet    Refill:  1   allopurinol (ZYLOPRIM) 100 MG tablet    Sig: Take 2 tablets (200 mg total) by mouth daily.    Dispense:  180 tablet    Refill:  1   Patient Instructions  Thanks for coming in today.  Depending on the cholesterol levels we may need to discuss further medication options to keep the triglyceride level lower.  No change in blood pressure medication for now or gout medication.  I did print  those prescriptions as you may be able to find them cheaper through good Rx at different pharmacies.  If any concerns on labs I will let you know.  Recheck in 6 months.  See other information below regarding keeping you healthy.  Take care!      Signed,   Meredith Staggers, MD [facility], [facility] 06/16/23 9:25 AM

## 2023-06-14 NOTE — Patient Instructions (Signed)
 Thanks for coming in today.  Depending on the cholesterol levels we may need to discuss further medication options to keep the triglyceride level lower.  No change in blood pressure medication for now or gout medication.  I did print those prescriptions as you may be able to find them cheaper through good Rx at different pharmacies.  If any concerns on labs I will let you know.  Recheck in 6 months.  See other information below regarding keeping you healthy.  Take care!

## 2023-06-15 LAB — COMPREHENSIVE METABOLIC PANEL
ALT: 33 U/L (ref 0–53)
AST: 41 U/L — ABNORMAL HIGH (ref 0–37)
Albumin: 4.5 g/dL (ref 3.5–5.2)
Alkaline Phosphatase: 63 U/L (ref 39–117)
BUN: 10 mg/dL (ref 6–23)
CO2: 27 meq/L (ref 19–32)
Calcium: 9.8 mg/dL (ref 8.4–10.5)
Chloride: 103 meq/L (ref 96–112)
Creatinine, Ser: 0.95 mg/dL (ref 0.40–1.50)
GFR: 89.4 mL/min (ref >60.00)
Glucose, Bld: 105 mg/dL — ABNORMAL HIGH (ref 70–99)
Potassium: 4.6 meq/L (ref 3.5–5.1)
Sodium: 138 meq/L (ref 135–145)
Total Bilirubin: 0.5 mg/dL (ref 0.2–1.2)
Total Protein: 7.3 g/dL (ref 6.0–8.3)

## 2023-06-15 LAB — LDL CHOLESTEROL, DIRECT: Direct LDL: 111 mg/dL

## 2023-06-15 LAB — LIPID PANEL
Cholesterol: 217 mg/dL — ABNORMAL HIGH (ref 0–200)
HDL: 55.7 mg/dL (ref >39.00)
NonHDL: 160.84
Total CHOL/HDL Ratio: 4
Triglycerides: 581 mg/dL — ABNORMAL HIGH (ref 0.0–149.0)
VLDL: 116.2 mg/dL — ABNORMAL HIGH (ref 0.0–40.0)

## 2023-06-15 LAB — HEMOGLOBIN A1C: Hgb A1c MFr Bld: 5.6 % (ref 4.6–6.5)

## 2023-06-19 ENCOUNTER — Telehealth: Payer: Self-pay

## 2023-06-19 DIAGNOSIS — E785 Hyperlipidemia, unspecified: Secondary | ICD-10-CM

## 2023-06-19 DIAGNOSIS — E781 Pure hyperglyceridemia: Secondary | ICD-10-CM

## 2023-06-19 NOTE — Telephone Encounter (Signed)
-----   Message from Shade Flood sent at 06/19/2023 10:48 AM EDT ----- Call patient.  Triglycerides are still high at 581.  We really should look at a medication to control these levels, especially with risk of pancreatitis as that level is over 500.  I know he wanted to avoid a statin but we can look at other options if he is open to medication.  Blood sugar few points elevated, 1 liver test slightly elevated, overall electrolytes look okay.  We can recheck these labs in the next few months.  41-month blood sugar test just below prediabetes range.  Let me know if there are questions and whether we can start a medication for triglycerides.

## 2023-06-20 NOTE — Telephone Encounter (Signed)
 Lab results have been discussed.   Verbalized understanding? Yes  Are there any questions? Yes   Patient is wondering what other medication options there are and their side effects, patient is also wondering if there is something he can do himself to help get these levels down?

## 2023-06-25 ENCOUNTER — Telehealth: Payer: Self-pay | Admitting: Family Medicine

## 2023-06-25 NOTE — Telephone Encounter (Signed)
 Copied from CRM [id number]. Topic: General - Billing Inquiry >> Jun 22, 2023  8:59 AM Elizebeth Brooking wrote: Reason for CRM: Patient called in stated he had a check written out for his copay, and seen that he was charged twice , would like for someone to call back in regards to this

## 2023-06-26 ENCOUNTER — Telehealth: Payer: Self-pay

## 2023-06-26 MED ORDER — FENOFIBRATE 48 MG PO TABS: 48.0000 mg | ORAL_TABLET | Freq: Every day | ORAL | 3 refills | Status: DC

## 2023-06-26 NOTE — Telephone Encounter (Signed)
 Unfortunately lipids have been elevated for some time, much higher in the past but the most recent testing has been above 500.  Slight elevated ASCVD risk score.  Alcohol use was discussed previously, and has cut back.  Called patient. Has been drinking more water, cutting back on sodas. Advised of trying to continue to cut back on alcohol and avoid if possible - risk of pancreatitis discussed. Will start low dose fenofibrate, with potential side effects discussed but I think he will tolerate this medicine well.  Dietary advice also given with avoidance of high glycemic foods, healthy fats like fish.  Continue to cut back on sodas or sugar containing beverages.  Please schedule lab visit in 6 weeks, labs have been ordered.  Lab Results  Component Value Date   CHOL 217 (H) 06/14/2023   HDL 55.70 06/14/2023   LDLCALC 127 (H) 04/14/2021   LDLDIRECT 111.0 06/14/2023   TRIG (H) 06/14/2023    581.0 Triglyceride is over 400; calculations on Lipids are invalid.   CHOLHDL 4 06/14/2023  The 10-year ASCVD risk score (Arnett DK, et al., 2019) is: 12.4%   Values used to calculate the score:     Age: 59 years     Sex: Male     Is Non-Hispanic African American: No     Diabetic: No     Tobacco smoker: Yes     Systolic Blood Pressure: 126 mmHg     Is BP treated: Yes     HDL Cholesterol: 55.7 mg/dL     Total Cholesterol: 217 mg/dL

## 2023-06-26 NOTE — Telephone Encounter (Signed)
 Patient is wondering what other medication options there are and their side effects, patient is also wondering if there is something he can do himself to help get these levels down?   Patient called back notes waiting on a response to question so he knows what to do about triglycerides

## 2023-06-26 NOTE — Telephone Encounter (Signed)
 See other call - I called patient this morning with plan. Thanks.

## 2023-06-26 NOTE — Telephone Encounter (Signed)
 Copied from CRM [id number]. Topic: Clinical - Lab/Test Results >> Jun 26, 2023  8:33 AM Dustin Logan wrote: Reason for CRM: Patient is calling following up from conversation he had with the nurse on 3/26 regarding his labs, patient states he had additional questions for Dr. Neva Seat and someone was supposed to reach back out to him with answers, please reach out to patient, thanks.  [employer] [phone]

## 2023-06-26 NOTE — Addendum Note (Signed)
 Addended by: Meredith Staggers R on: 06/26/2023 10:46 AM   Modules accepted: Orders

## 2023-06-26 NOTE — Telephone Encounter (Signed)
 Patient set up for repeat lab visit

## 2023-06-27 ENCOUNTER — Telehealth: Payer: Self-pay

## 2023-06-27 NOTE — Telephone Encounter (Signed)
 Called patient and discussed the billing department and provided their phone number patient was very grateful for the assistance

## 2023-06-27 NOTE — Telephone Encounter (Signed)
 Copied from CRM [id number]. Topic: General - Billing Inquiry >> Jun 27, 2023  7:59 AM Kathryne Eriksson wrote: Reason for CRM: Requesting A Call Back >> Jun 27, 2023  8:04 AM Kathryne Eriksson wrote: Patient is requesting a call back [phone]. States he was charged twice for his copay, 1 payment was made via a check and the other was pulled automatically from this bank. Patient like to speak to someone urgently so that he's able to receive one of the payments back.

## 2023-08-02 ENCOUNTER — Other Ambulatory Visit (INDEPENDENT_AMBULATORY_CARE_PROVIDER_SITE_OTHER): Payer: Self-pay

## 2023-08-02 DIAGNOSIS — E781 Pure hyperglyceridemia: Secondary | ICD-10-CM

## 2023-08-03 LAB — LIPID PANEL
Cholesterol: 213 mg/dL — ABNORMAL HIGH (ref 0–200)
HDL: 56.8 mg/dL (ref >39.00)
LDL Cholesterol: 77 mg/dL (ref 0–99)
NonHDL: 156.52
Total CHOL/HDL Ratio: 4
Triglycerides: 399 mg/dL — ABNORMAL HIGH (ref 0.0–149.0)
VLDL: 79.8 mg/dL — ABNORMAL HIGH (ref 0.0–40.0)

## 2023-08-03 LAB — COMPREHENSIVE METABOLIC PANEL WITH GFR
ALT: 29 U/L (ref 0–53)
AST: 41 U/L — ABNORMAL HIGH (ref 0–37)
Albumin: 4.5 g/dL (ref 3.5–5.2)
Alkaline Phosphatase: 54 U/L (ref 39–117)
BUN: 13 mg/dL (ref 6–23)
CO2: 28 meq/L (ref 19–32)
Calcium: 9.8 mg/dL (ref 8.4–10.5)
Chloride: 107 meq/L (ref 96–112)
Creatinine, Ser: 1.24 mg/dL (ref 0.40–1.50)
GFR: 64.87 mL/min (ref >60.00)
Glucose, Bld: 81 mg/dL (ref 70–99)
Potassium: 4.7 meq/L (ref 3.5–5.1)
Sodium: 142 meq/L (ref 135–145)
Total Bilirubin: 0.7 mg/dL (ref 0.2–1.2)
Total Protein: 7.4 g/dL (ref 6.0–8.3)

## 2023-08-07 ENCOUNTER — Ambulatory Visit: Payer: Self-pay | Admitting: Family Medicine

## 2023-08-07 DIAGNOSIS — E781 Pure hyperglyceridemia: Secondary | ICD-10-CM

## 2023-08-07 DIAGNOSIS — E785 Hyperlipidemia, unspecified: Secondary | ICD-10-CM

## 2023-08-08 MED ORDER — ATORVASTATIN CALCIUM 10 MG PO TABS
10.0000 mg | ORAL_TABLET | Freq: Every day | ORAL | 2 refills | Status: DC
Start: 2023-08-08 — End: 2023-08-15

## 2023-08-08 NOTE — Addendum Note (Signed)
 Addended by: Glennis Montenegro R on: 08/08/2023 09:20 PM   Modules accepted: Orders

## 2023-08-08 NOTE — Telephone Encounter (Signed)
 Continue lipitor - refill ordered.

## 2023-08-09 NOTE — Telephone Encounter (Signed)
Pt has been informed of refill

## 2023-08-15 ENCOUNTER — Telehealth: Payer: Self-pay

## 2023-08-15 DIAGNOSIS — E781 Pure hyperglyceridemia: Secondary | ICD-10-CM

## 2023-08-15 DIAGNOSIS — E785 Hyperlipidemia, unspecified: Secondary | ICD-10-CM

## 2023-08-15 MED ORDER — ATORVASTATIN CALCIUM 10 MG PO TABS: 10.0000 mg | ORAL_TABLET | Freq: Every day | ORAL | 2 refills | Status: DC

## 2023-08-15 MED ORDER — FENOFIBRATE 48 MG PO TABS: 48.0000 mg | ORAL_TABLET | Freq: Every day | ORAL | 3 refills | Status: AC

## 2023-08-15 NOTE — Telephone Encounter (Signed)
 Called patient to clarify medication and pharmacy is correct. Patient states he needed the atorvastatin  and fenofibrate  sent to the [facility] on [address]. I did resend these medications to this pharmacy.

## 2023-08-15 NOTE — Telephone Encounter (Deleted)
 Copied from CRM [id number]. Topic: General - Other >> Aug 15, 2023 10:46 AM Jenice Mitts wrote: Reason for CRM: Patient is calling because he is trying to see if his Atorvastatin   cholesterol medication was sent over to [id number] - [place], [place] - [address] AT [facility] OF [address] [place] [address] Phone: [phone] Fax: [phone]

## 2023-08-15 NOTE — Telephone Encounter (Signed)
 Copied from CRM [id number]. Topic: General - Other >> Aug 15, 2023 10:46 AM Jenice Mitts wrote: Reason for CRM: Patient is calling because he is trying to see if his Atorvastatin   cholesterol medication was sent over to [id number] - [place], [place] - [address] AT [facility] OF [address] [place] [address] Phone: [phone] Fax: [phone]

## 2023-08-21 ENCOUNTER — Other Ambulatory Visit: Payer: Self-pay | Admitting: Family Medicine

## 2023-08-21 NOTE — Progress Notes (Signed)
 He is not on Lipitor, discontinued.

## 2023-08-21 NOTE — Telephone Encounter (Signed)
 Called patient to clarify meds given recent refill of both Lipitor and fenofibrate .  He has actually only been taking fenofibrate , which is what he was on for his last labs.  Will discontinue Lipitor from his med list.  Has follow-up with me in September.

## 2023-09-22 ENCOUNTER — Other Ambulatory Visit: Payer: Self-pay | Admitting: Family Medicine

## 2023-09-22 DIAGNOSIS — E781 Pure hyperglyceridemia: Secondary | ICD-10-CM

## 2023-12-13 ENCOUNTER — Ambulatory Visit: Payer: Self-pay | Admitting: Family Medicine

## 2024-01-17 ENCOUNTER — Ambulatory Visit: Payer: Self-pay | Admitting: Family Medicine
